# Patient Record
Sex: Female | Born: 1985 | Race: White | Hispanic: No | Marital: Single | Smoking: Never smoker
Health system: Southern US, Community
[De-identification: ages and names within clinical notes are randomized; demographics above are authoritative.]

## PROBLEM LIST (undated history)

## (undated) DIAGNOSIS — E079 Disorder of thyroid, unspecified: Secondary | ICD-10-CM

## (undated) DIAGNOSIS — F419 Anxiety disorder, unspecified: Secondary | ICD-10-CM

## (undated) HISTORY — DX: Disorder of thyroid, unspecified: E07.9

## (undated) HISTORY — DX: Anxiety disorder, unspecified: F41.9

## (undated) HISTORY — PX: WISDOM TOOTH EXTRACTION: SHX21

---

## 2007-02-12 ENCOUNTER — Emergency Department (HOSPITAL_COMMUNITY): Admission: EM | Admit: 2007-02-12 | Discharge: 2007-02-12 | Payer: Self-pay | Admitting: Emergency Medicine

## 2008-09-14 ENCOUNTER — Emergency Department (HOSPITAL_COMMUNITY): Admission: EM | Admit: 2008-09-14 | Discharge: 2008-09-15 | Payer: Self-pay | Admitting: Emergency Medicine

## 2010-06-18 IMAGING — CR DG CHEST 2V
2 series · 2 of 2 positions shown · non-contrast
Comparison: None.

CLINICAL DATA: Left-sided chest pain.
Pneumothorax.

CHEST - 2 VIEW

[w chest pa]
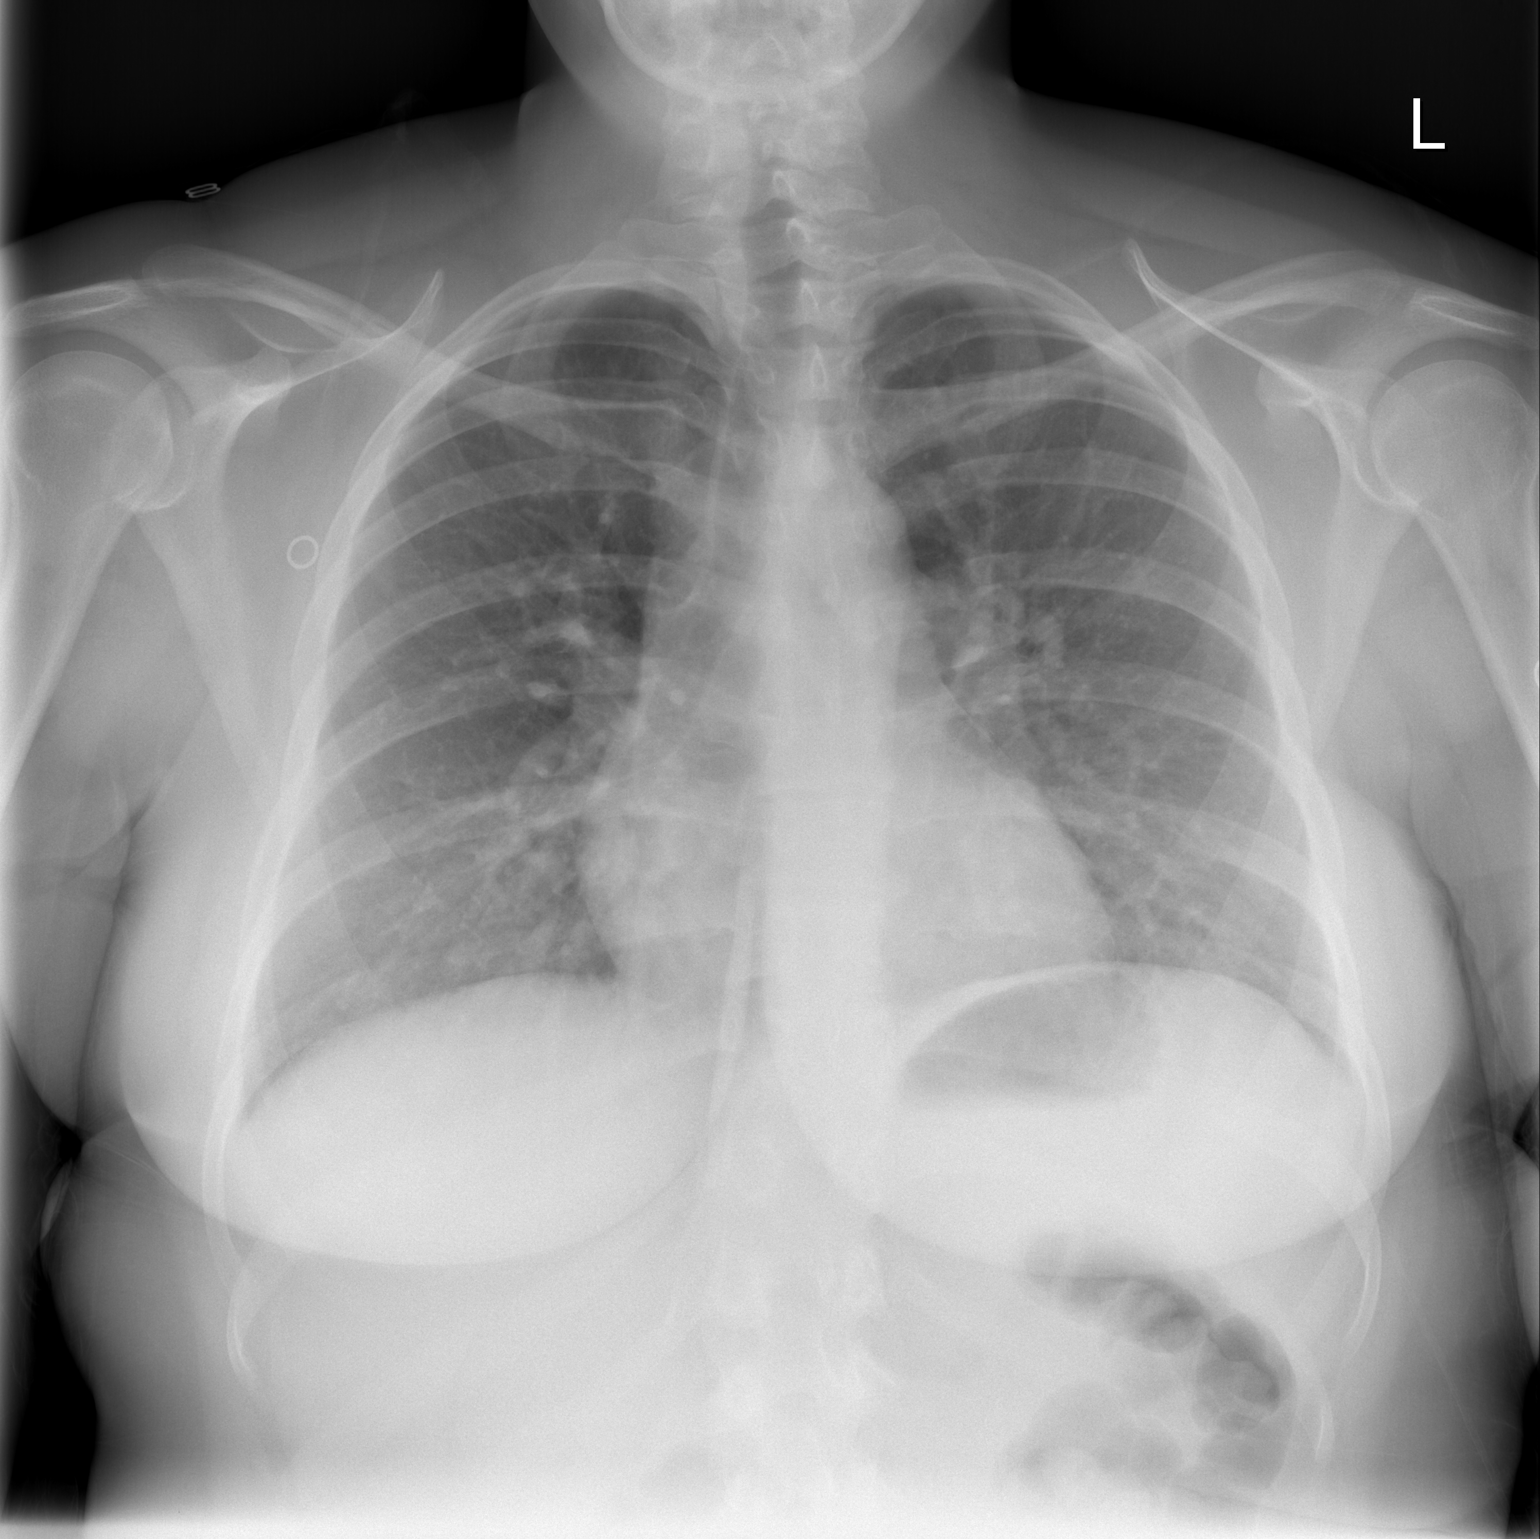

[w chest lat]
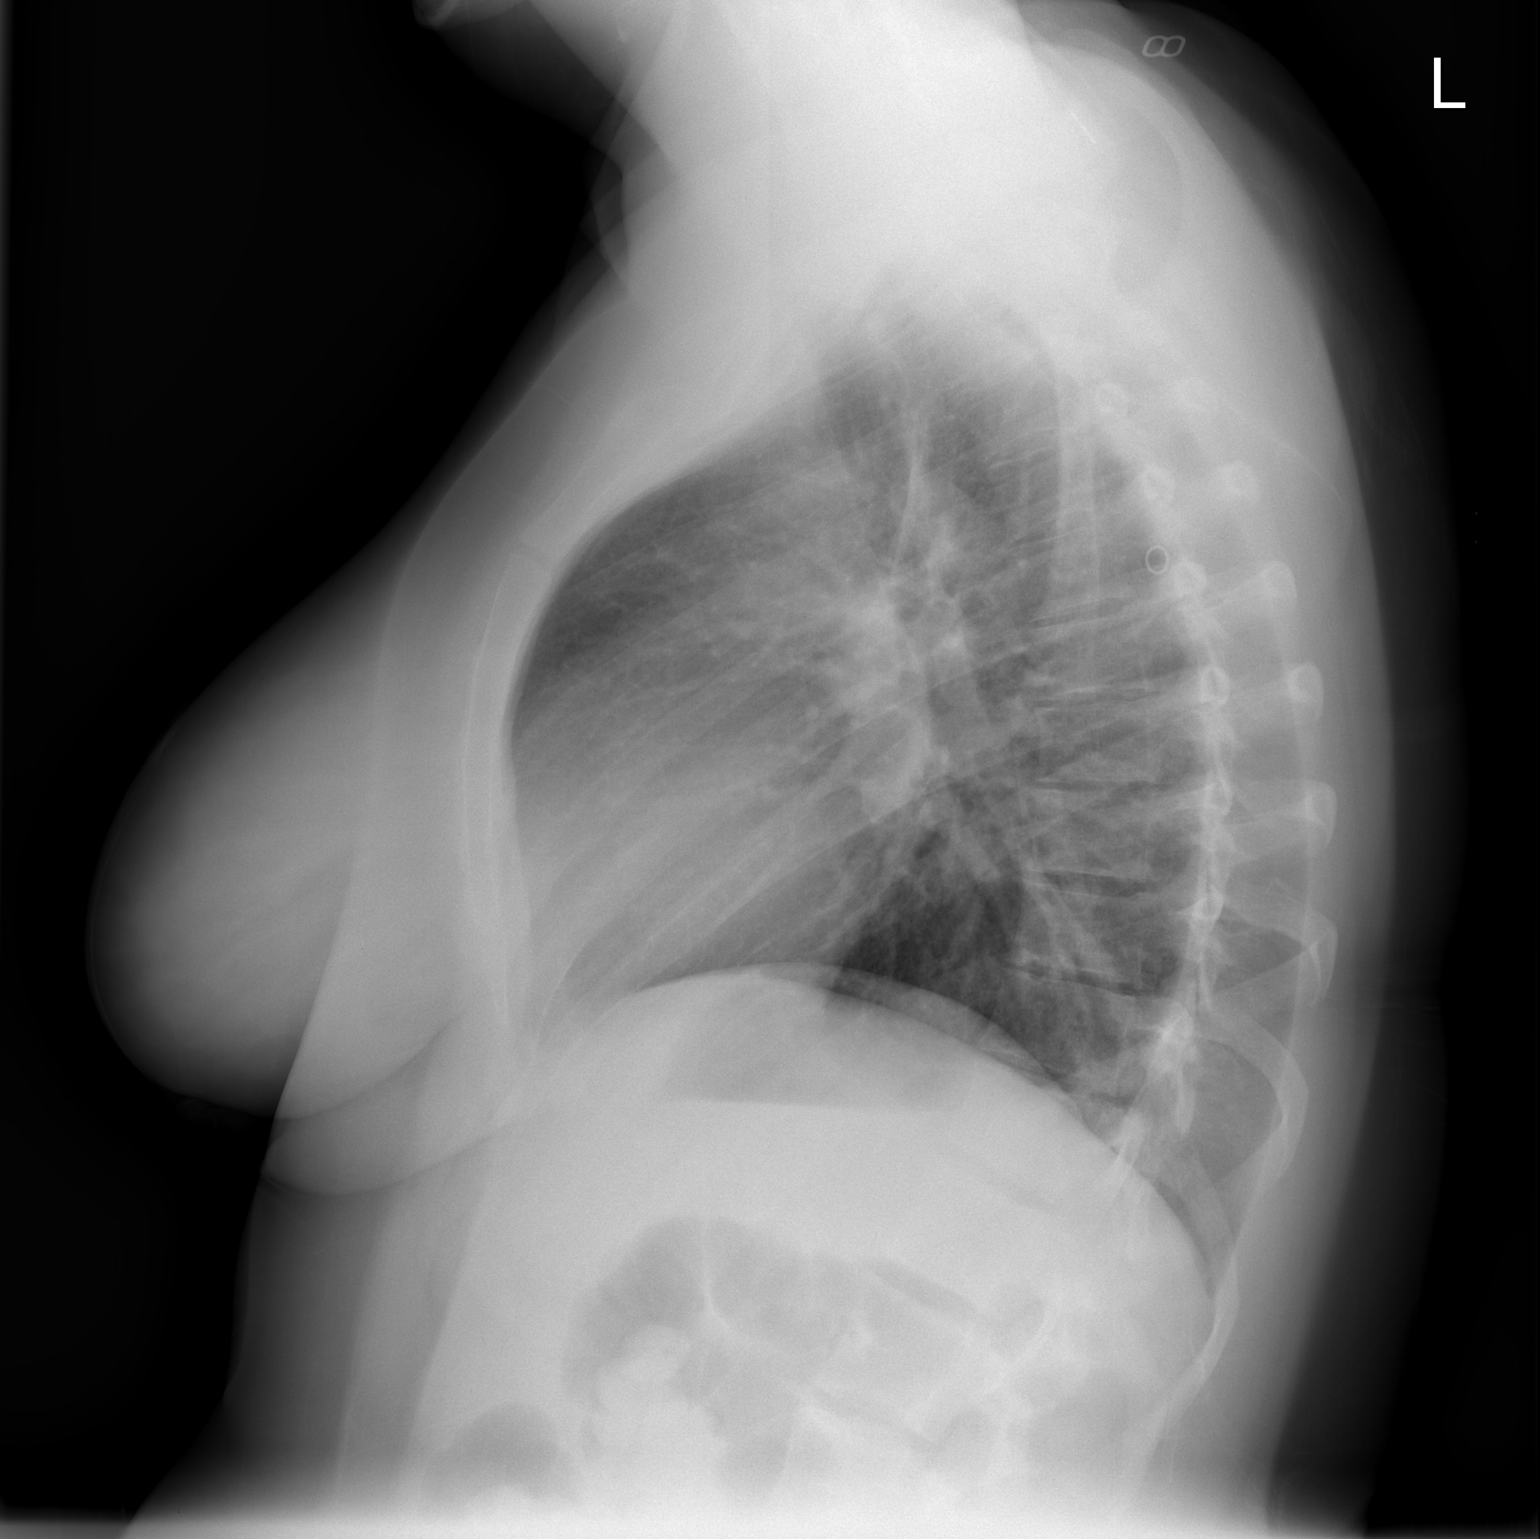

[2 of 2 positions shown; findings below may reference images not displayed]

FINDINGS: No infiltrate, congestive heart failure or
pneumothorax.  Mild central pulmonary vascular prominence.  Heart
size within normal limits.  Mild thoracic kyphosis.
IMPRESSION: No infiltrate, congestive heart failure or
pneumothorax.

## 2010-12-11 LAB — URINALYSIS, ROUTINE W REFLEX MICROSCOPIC
Bilirubin Urine: NEGATIVE
Ketones, ur: 15 — AB
Nitrite: NEGATIVE
Protein, ur: NEGATIVE
Urobilinogen, UA: 0.2

## 2010-12-11 LAB — BASIC METABOLIC PANEL
BUN: 9
CO2: 26
Calcium: 9.5
GFR calc non Af Amer: >60
Glucose, Bld: 97
Sodium: 138

## 2010-12-11 LAB — CBC
HCT: 40.4
Hemoglobin: 14.3
MCHC: 35.3
Platelets: 299
RDW: 13.2

## 2010-12-11 LAB — DIFFERENTIAL
Basophils Absolute: 0
Basophils Relative: 0
Eosinophils Relative: 0
Monocytes Absolute: 0.3
Neutro Abs: 10.3 — ABNORMAL HIGH

## 2013-10-30 ENCOUNTER — Ambulatory Visit: Payer: Self-pay | Admitting: Physician Assistant

## 2013-11-05 ENCOUNTER — Ambulatory Visit (INDEPENDENT_AMBULATORY_CARE_PROVIDER_SITE_OTHER): Payer: BC Managed Care – PPO | Admitting: Physician Assistant

## 2013-11-05 ENCOUNTER — Encounter: Payer: Self-pay | Admitting: Physician Assistant

## 2013-11-05 VITALS — BP 110/70 | HR 80 | Temp 98.0°F | Ht 67.0 in | Wt 251.0 lb

## 2013-11-05 DIAGNOSIS — K2100 Gastro-esophageal reflux disease with esophagitis, without bleeding: Secondary | ICD-10-CM | POA: Insufficient documentation

## 2013-11-05 DIAGNOSIS — R1013 Epigastric pain: Secondary | ICD-10-CM

## 2013-11-05 DIAGNOSIS — IMO0001 Reserved for inherently not codable concepts without codable children: Secondary | ICD-10-CM | POA: Insufficient documentation

## 2013-11-05 DIAGNOSIS — E063 Autoimmune thyroiditis: Secondary | ICD-10-CM | POA: Insufficient documentation

## 2013-11-05 DIAGNOSIS — F411 Generalized anxiety disorder: Secondary | ICD-10-CM

## 2013-11-05 LAB — COMPREHENSIVE METABOLIC PANEL
ALBUMIN: 4 g/dL (ref 3.5–5.2)
ALT: 75 U/L — ABNORMAL HIGH (ref 0–35)
AST: 46 U/L — ABNORMAL HIGH (ref 0–37)
Alkaline Phosphatase: 44 U/L (ref 39–117)
BUN: 14 mg/dL (ref 6–23)
CALCIUM: 9.6 mg/dL (ref 8.4–10.5)
CHLORIDE: 102 meq/L (ref 96–112)
CO2: 28 mEq/L (ref 19–32)
Creatinine, Ser: 1 mg/dL (ref 0.4–1.2)
GFR: 71.93 mL/min (ref >60.00)
GLUCOSE: 83 mg/dL (ref 70–99)
POTASSIUM: 4 meq/L (ref 3.5–5.1)
SODIUM: 138 meq/L (ref 135–145)
TOTAL PROTEIN: 7.8 g/dL (ref 6.0–8.3)
Total Bilirubin: 0.6 mg/dL (ref 0.2–1.2)

## 2013-11-05 LAB — CBC
HCT: 39.7 % (ref 36.0–46.0)
Hemoglobin: 13.5 g/dL (ref 12.0–15.0)
MCHC: 34.1 g/dL (ref 30.0–36.0)
MCV: 87.8 fl (ref 78.0–100.0)
PLATELETS: 282 10*3/uL (ref 150.0–400.0)
RBC: 4.52 Mil/uL (ref 3.87–5.11)
RDW: 13 % (ref 11.5–15.5)
WBC: 8.3 10*3/uL (ref 4.0–10.5)

## 2013-11-05 LAB — LIPASE: LIPASE: 18 U/L (ref 11.0–59.0)

## 2013-11-05 LAB — H. PYLORI ANTIBODY, IGG: H PYLORI IGG: NEGATIVE

## 2013-11-05 MED ORDER — OMEPRAZOLE 40 MG PO CPDR: 40.0000 mg | DELAYED_RELEASE_CAPSULE | Freq: Every day | ORAL | Status: DC

## 2013-11-05 NOTE — Assessment & Plan Note (Signed)
Patient does not wish to proceed with Rx medication at present.  Discussed supplements including Valerian Root, Melatonin and St. John's Wort to help with anxiety.  Follow-up PRN.

## 2013-11-05 NOTE — Assessment & Plan Note (Signed)
Will obtain CBC, CMP, lipase and H. Pylori IgG due to epigastric and esophageal discomfort.  Rx Prilosec 40 mg daily.  Avoid late night eating and trigger foods.  Elevate HOB. Have recommended evaluation by GI. Patient wishes to get lab results and use Prilosec first.  I am ok with this.

## 2013-11-05 NOTE — Progress Notes (Signed)
Pre visit review using our clinic review tool, if applicable. No additional management support is needed unless otherwise documented below in the visit note. 

## 2013-11-05 NOTE — Patient Instructions (Signed)
Please take Prilosec once daily as directed.  Stay well hydrated.  Continue your other medications as directed.  Avoid alcohol and anti-inflammatories (Iburprofen, Aleve).  I will call you with your lab results.  If symptoms are not improving despite medication, we will set you up with Gastroenterology for endoscopy.   For stress, consider letting us start a medication to help with symptoms.  I would recommend Celexa.  Please read information below.  You can also give some thought to [facility]. John's Wort or Valerian root.  Continue your Melatonin nightly.  We will schedule follow-up based on your lab results.   Valerian, Valeriana officinalis oral dosage forms What is this medicine? VALERIAN (vuh LEER ee uhn) is an herbal or dietary supplement. It is promoted to help relaxation, sleep and stress. The FDA has not approved this supplement for any medical use. This supplement may be used for other purposes; ask your health care provider or pharmacist if you have questions. This medicine may be used for other purposes; ask your health care provider or pharmacist if you have questions. What should I tell my health care provider before I take this medicine? They need to know if you have any of these conditions: -drug abuse or addiction -emotional illness like anxiety, depression -heart disease -kidney disease -liver disease -if you frequently drink alcohol containing drinks -sleeping problems -an unusual or allergic reaction to valerian, herbs, plants, other medicines, foods, dyes, or preservatives -pregnant or trying to get pregnant -breast-feeding How should I use this medicine? Take this supplement by mouth with a glass of water. Follow the directions on the package labeling, or take as directed by your health care professional. If this supplement upsets your stomach, take it with food. Do not take this supplement more often than directed. Contact your pediatrician regarding the use of this  supplement in children. Special care may be needed. Overdosage: If you think you have taken too much of this medicine contact a poison control center or emergency room at once. NOTE: This medicine is only for you. Do not share this medicine with others. What if I miss a dose? If you miss a dose, take it as soon as you can. If it is almost time for your next dose, take only that dose. Do not take double or extra doses. What may interact with this medicine? Check with your doctor or healthcare professional if you are taking any of the following medications: -alcohol -barbiturate medicines for sleep or seizures -medicines for depression, anxiety, or psychotic disturbances -medicines for sleep -muscle relaxants -narcotic pain medicines This list may not describe all possible interactions. Give your health care provider a list of all the medicines, herbs, non-prescription drugs, or dietary supplements you use. Also tell them if you smoke, drink alcohol, or use illegal drugs. Some items may interact with your medicine. What should I watch for while using this medicine? See your doctor if your symptoms do not get better or if they get worse. You may get drowsy or dizzy. Do not drive, use machinery, or do anything that needs mental alertness until you know how this medicine affects you. Do not stand or sit up quickly, especially if you are an older patient. This reduces the risk of dizzy or fainting spells. Alcohol may interfere with the effect of this medicine. If you are scheduled for any medical or dental procedure, tell your healthcare provider that you are taking this supplement. You may need to stop taking this supplement before the procedure.  Herbal or dietary supplements are not regulated like medicines. Rigid quality control standards are not required for dietary supplements. The purity and strength of these products can vary. The safety and effect of this dietary supplement for a certain disease  or illness is not well known. This product is not intended to diagnose, treat, cure or prevent any disease. The Food and Drug Administration suggests the following to help consumers protect themselves: -Always read product labels and follow directions. -Natural does not mean a product is safe for humans to take. -Look for products that include USP after the ingredient name. This means that the manufacturer followed the standards of the [place] Pharmacopoeia. -Supplements made or sold by a nationally known food or drug company are more likely to be made under tight controls. You can write to the company for more information about how the product was made. What side effects may I notice from receiving this medicine? Side effects that you should report to your doctor or health care professional as soon as possible: -allergic reactions like skin rash, itching or hives, swelling of the face, lips, or tongue -breathing problems -changes in emotions or moods, like depressed mood -confused, forgetful -dark urine -fast, irregular heartbeat -problems with balance, talking, walking -unusually weak or tired -yellowing of the eyes, skin Side effects that usually do not require medical attention (report to your doctor or health care professional if they continue or are bothersome): -stomach upset -dizziness -tiredness This list may not describe all possible side effects. Call your doctor for medical advice about side effects. You may report side effects to FDA at 1-800-FDA-1088. Where should I keep my medicine? Keep out of the reach of children. Store at room temperature or as directed on the package label. Protect from moisture. Throw away any unused supplement after the expiration date. NOTE: This sheet is a summary. It may not cover all possible information. If you have questions about this medicine, talk to your doctor, pharmacist, or health care provider.  2015, Elsevier/Gold Standard. (2007-11-17  17:33:52)   Citalopram tablets What is this medicine? CITALOPRAM (sye TAL oh pram) is a medicine for depression. This medicine may be used for other purposes; ask your health care provider or pharmacist if you have questions. COMMON BRAND NAME(S): Celexa What should I tell my health care provider before I take this medicine? They need to know if you have any of these conditions: -bipolar disorder or a family history of bipolar disorder -diabetes -glaucoma -heart disease -history of irregular heartbeat -kidney or liver disease -low levels of magnesium or potassium in the blood -receiving electroconvulsive therapy -seizures (convulsions) -suicidal thoughts or a previous suicide attempt -an unusual or allergic reaction to citalopram, escitalopram, other medicines, foods, dyes, or preservatives -pregnant or trying to become pregnant -breast-feeding How should I use this medicine? Take this medicine by mouth with a glass of water. Follow the directions on the prescription label. You can take it with or without food. Take your medicine at regular intervals. Do not take your medicine more often than directed. Do not stop taking this medicine suddenly except upon the advice of your doctor. Stopping this medicine too quickly may cause serious side effects or your condition may worsen. A special MedGuide will be given to you by the pharmacist with each prescription and refill. Be sure to read this information carefully each time. Talk to your pediatrician regarding the use of this medicine in children. Special care may be needed. Patients over 43 years old 45  have a stronger reaction and need a smaller dose. Overdosage: If you think you have taken too much of this medicine contact a poison control center or emergency room at once. NOTE: This medicine is only for you. Do not share this medicine with others. What if I miss a dose? If you miss a dose, take it as soon as you can. If it is almost  time for your next dose, take only that dose. Do not take double or extra doses. What may interact with this medicine? Do not take this medicine with any of the following medications: -certain medicines for fungal infections like fluconazole, itraconazole, ketoconazole, posaconazole, voriconazole -cisapride -dofetilide -dronedarone -escitalopram -linezolid -MAOIs like Carbex, Eldepryl, Marplan, Nardil, and Parnate -methylene blue (injected into a vein) -pimozide -thioridazine -ziprasidone This medicine may also interact with the following medications: -alcohol -aspirin and aspirin-like medicines -carbamazepine -certain medicines for depression, anxiety, or psychotic disturbances -certain medicines for infections like chloroquine, clarithromycin, erythromycin, furazolidone, isoniazid, pentamidine -certain medicines for migraine headaches like almotriptan, eletriptan, frovatriptan, naratriptan, rizatriptan, sumatriptan, zolmitriptan -certain medicines for sleep -certain medicines that treat or prevent blood clots like dalteparin, enoxaparin, warfarin -cimetidine -diuretics -fentanyl -lithium -methadone -metoprolol -NSAIDs, medicines for pain and inflammation, like ibuprofen or naproxen -omeprazole -other medicines that prolong the QT interval (cause an abnormal heart rhythm) -procarbazine -rasagiline -supplements like St. John's wort, kava kava, valerian -tramadol -tryptophan This list may not describe all possible interactions. Give your health care provider a list of all the medicines, herbs, non-prescription drugs, or dietary supplements you use. Also tell them if you smoke, drink alcohol, or use illegal drugs. Some items may interact with your medicine. What should I watch for while using this medicine? Tell your doctor if your symptoms do not get better or if they get worse. Visit your doctor or health care professional for regular checks on your progress. Because it may  take several weeks to see the full effects of this medicine, it is important to continue your treatment as prescribed by your doctor. Patients and their families should watch out for new or worsening thoughts of suicide or depression. Also watch out for sudden changes in feelings such as feeling anxious, agitated, panicky, irritable, hostile, aggressive, impulsive, severely restless, overly excited and hyperactive, or not being able to sleep. If this happens, especially at the beginning of treatment or after a change in dose, call your health care professional. Bonita Quin may get drowsy or dizzy. Do not drive, use machinery, or do anything that needs mental alertness until you know how this medicine affects you. Do not stand or sit up quickly, especially if you are an older patient. This reduces the risk of dizzy or fainting spells. Alcohol may interfere with the effect of this medicine. Avoid alcoholic drinks. Your mouth may get dry. Chewing sugarless gum or sucking hard candy, and drinking plenty of water will help. Contact your doctor if the problem does not go away or is severe. What side effects may I notice from receiving this medicine? Side effects that you should report to your doctor or health care professional as soon as possible: -allergic reactions like skin rash, itching or hives, swelling of the face, lips, or tongue -chest pain -confusion -dizziness -fast, irregular heartbeat -fast talking and excited feelings or actions that are out of control -feeling faint or lightheaded, falls -hallucination, loss of contact with reality -seizures -shortness of breath -suicidal thoughts or other mood changes -unusual bleeding or bruising Side effects that usually do not require  medical attention (report to your doctor or health care professional if they continue or are bothersome): -blurred vision -change in appetite -change in sex drive or performance -headache -increased  sweating -nausea -trouble sleeping This list may not describe all possible side effects. Call your doctor for medical advice about side effects. You may report side effects to FDA at 1-800-FDA-1088. Where should I keep my medicine? Keep out of reach of children. Store at room temperature between 15 and 30 degrees C (59 and 86 degrees F). Throw away any unused medicine after the expiration date. NOTE: This sheet is a summary. It may not cover all possible information. If you have questions about this medicine, talk to your doctor, pharmacist, or health care provider.  2015, Elsevier/Gold Standard. (2012-09-12 13:19:48)

## 2013-11-05 NOTE — Progress Notes (Signed)
Patient presents to clinic today to establish care.  Acute Concerns: Anxiety -- With occasional trichotillomania.  Denies overt panic attack.  Endorses anxiety stemming mostly from increased work stressors.  Is taking Melatonin at bedtime. Denies depressed mood, SI/HI. Also with history  Is hesitant to start medications.  GERD -- Patient complains of frequent heartburn and reflux associated with occasional epigastric discomfort.  Patient denies nausea or vomiting.  Endorses globus sensation.  Denies melena or hematochezia.  Is a rare alcohol drinker.  Denies excess NSAID use.  Denies hx of PUD.  Has only take TUMS for her symptoms.  Chronic Issues: Hashimoto's Thyroiditis -- Followed by Endocrinology ([facility] -- Dr. Clair Gulling).  Patient has brought in labs for our EMR.  Contraception -- Followed by [facility]. Currently on Skyla impant.   Health Maintenance: Dental -- up-to-date Vision -- up-to-date Immunizations -- Tetanus 2006.  PAP -- Last in 2014; no abnormal findings -- [facility].   Past Medical History  Diagnosis Date  . Anxiety   . Thyroid disease     Hashimoto's Thyroiditis    Past Surgical History  Procedure Laterality Date  . Wisdom tooth extraction      No current outpatient prescriptions on file prior to visit.   No current facility-administered medications on file prior to visit.    Allergies  Allergen Reactions  . Penicillins   . Amoxicillin Rash    Family History  Problem Relation Age of Onset  . Cancer Paternal Grandmother     Lymphoma  . Diabetes Paternal Grandfather     History   Social History  . Marital Status: Single    Spouse Name: N/A    Number of Children: N/A  . Years of Education: N/A   Occupational History  . Not on file.   Social History Main Topics  . Smoking status: Never Smoker   . Smokeless tobacco: Never Used  . Alcohol Use: Yes     Comment: socially  . Drug Use: No  . Sexual  Activity: Yes     Comment: men-- on [place]   Other Topics Concern  . Not on file   Social History Narrative  . No narrative on file   ROS See HPI.  All other ROS are negative.  BP 110/70  Pulse 80  Temp(Src) 98 F (36.7 C)  Ht  (1.702 m)  Wt 251 lb (113.853 kg)  BMI 39.30 kg/m2  SpO2 99%  Physical Exam  Vitals reviewed. Constitutional: She is oriented to person, place, and time and well-developed, well-nourished, and in no distress.  HENT:  Head: Normocephalic and atraumatic.  Right Ear: External ear normal.  Left Ear: External ear normal.  Nose: Nose normal.  Mouth/Throat: Oropharynx is clear and moist. No oropharyngeal exudate.  TM within normal limits bilaterally.  Eyes: Conjunctivae are normal. Pupils are equal, round, and reactive to light.  Neck: Neck supple.  Cardiovascular: Normal rate, regular rhythm, normal heart sounds and intact distal pulses.   Pulmonary/Chest: Effort normal and breath sounds normal.  Abdominal: Soft. Bowel sounds are normal. She exhibits no distension and no mass. There is no tenderness. There is no rebound and no guarding.  Neurological: She is alert and oriented to person, place, and time.  Skin: Skin is warm and dry. No rash noted.  Psychiatric: Affect normal.   Assessment/Plan: Gastroesophageal reflux disease with esophagitis Will obtain CBC, CMP, lipase and H. Pylori IgG due to epigastric and esophageal discomfort.  Rx Prilosec  40 mg daily.  Avoid late night eating and trigger foods.  Elevate HOB. Have recommended evaluation by GI. Patient wishes to get lab results and use Prilosec first.  I am ok with this.  Abdominal pain, epigastric Will obtain labs to further assess.  Likely GERD related. Take medications as directed.  Avoid alcohol and NSAID use.  Will refer to GI pending lab results.  Generalized anxiety disorder Patient does not wish to proceed with Rx medication at present.  Discussed supplements including Valerian Root,  Melatonin and St. John's Wort to help with anxiety.  Follow-up PRN.

## 2013-11-05 NOTE — Assessment & Plan Note (Signed)
Will obtain labs to further assess.  Likely GERD related. Take medications as directed.  Avoid alcohol and NSAID use.  Will refer to GI pending lab results.

## 2013-11-12 ENCOUNTER — Telehealth: Payer: Self-pay | Admitting: Physician Assistant

## 2013-11-12 NOTE — Telephone Encounter (Signed)
Caller name: Youa Relation to pt: self Call back number: [phone] Pharmacy:  Reason for call:   Patient states that she has questions regarding liver enzymes results

## 2013-11-12 NOTE — Telephone Encounter (Signed)
Notes Recorded by Eustace Quail, RMA on 11/05/2013 at 3:14 PM Pt notified. Pt verbalized understanding. Notes Recorded by Waldon Merl, PA-C on 11/05/2013 at 2:28 PM Labs look good overall. H. Pylori is negative. She does have very mild liver enzyme elevation. I would like to recheck her liver function in 1 month. Avoid alcohol and tylenol-containing products. Take Prilosec daily over the next 2 weeks. If symptoms are not improving, will send her to GI for an endoscopy and evaluation.

## 2013-12-01 ENCOUNTER — Telehealth: Payer: Self-pay | Admitting: Physician Assistant

## 2013-12-01 DIAGNOSIS — R748 Abnormal levels of other serum enzymes: Secondary | ICD-10-CM

## 2013-12-01 NOTE — Telephone Encounter (Signed)
Caller name: Ashely Relation to pt: Call back number: [phone]   Reason for call:  See previous phone message from 11/12/2013.  Pt would like a phone call to discuss.

## 2013-12-02 NOTE — Telephone Encounter (Signed)
Spoke with patient and went over results in detail, pt understood & agreed; lab order for repeat BMET faxed to [facility] at patient request. Patient informed that we will call her as soon as we receive [facility]. Pt reports she is doing great on the Prilosec and will continue daily/SLS

## 2013-12-02 NOTE — Telephone Encounter (Signed)
Pt following up, states she has been waiting almost a month to get lab results.

## 2013-12-02 NOTE — Telephone Encounter (Signed)
Call Documentation     Regis BillSharon L Scates, CMA at 11/12/2013 1:09 PM     Status: Signed        Notes Recorded by Eustace Quailavid J Reabold, RMA on 11/05/2013 at 3:14 PM Pt notified. Pt verbalized understanding. Notes Recorded by Waldon MerlWilliam C Martin, PA-C on 11/05/2013 at 2:28 PM Labs look good overall. H. Pylori is negative. She does have very mild liver enzyme elevation. I would like to recheck her liver function in 1 month. Avoid alcohol and tylenol-containing products. Take Prilosec daily over the next 2 weeks. If symptoms are not improving, will send her to GI for an endoscopy and evaluation.          Dedra SkeensStephanie S Stevens at 11/12/2013 9:20 AM     Status: Signed        Caller name: Morrie Sheldonshley  Relation to pt: self  Call back number: [phone]  Pharmacy:  Reason for call:  Patient states that she has questions regarding liver enzymes results

## 2013-12-04 ENCOUNTER — Telehealth: Payer: Self-pay | Admitting: Physician Assistant

## 2013-12-04 NOTE — Telephone Encounter (Signed)
Caller name: [facility]  Relation to pt: other  Call back number: [phone]  Reason for call:  Patient was seen today [facility] is requesting ICD 10 Dx Code

## 2013-12-04 NOTE — Telephone Encounter (Signed)
Called [facility] and gave ICD-10 Code: R74.8, Elevated Liver Enzymes/SLS

## 2014-01-03 LAB — HM PAP SMEAR: HM PAP: NORMAL

## 2014-04-02 ENCOUNTER — Other Ambulatory Visit: Payer: Self-pay | Admitting: Physician Assistant

## 2014-04-02 NOTE — Telephone Encounter (Signed)
Rx request to pharmacy/SLS  

## 2014-04-09 ENCOUNTER — Ambulatory Visit (INDEPENDENT_AMBULATORY_CARE_PROVIDER_SITE_OTHER): Payer: BLUE CROSS/BLUE SHIELD | Admitting: Physician Assistant

## 2014-04-09 ENCOUNTER — Encounter: Payer: Self-pay | Admitting: Physician Assistant

## 2014-04-09 VITALS — BP 116/73 | HR 86 | Temp 98.1°F | Resp 16 | Ht 67.0 in | Wt 253.5 lb

## 2014-04-09 DIAGNOSIS — S29011A Strain of muscle and tendon of front wall of thorax, initial encounter: Secondary | ICD-10-CM

## 2014-04-09 DIAGNOSIS — S29091A Other injury of muscle and tendon of front wall of thorax, initial encounter: Secondary | ICD-10-CM

## 2014-04-09 NOTE — Progress Notes (Signed)
Pre visit review using our clinic review tool, if applicable. No additional management support is needed unless otherwise documented below in the visit note/SLS  

## 2014-04-09 NOTE — Patient Instructions (Signed)
Please take Ibuprofen every 6-8 hours for the next few days to help lower inflammation.  Apply topical Aspercreme to the shoulder.  Avoid heavy lifting or overhead motion of the arms over the next week. Ok to continue aerobic exercise.   Rotator Cuff Tendinitis  Rotator cuff tendinitis is inflammation of the tough, cord-like bands that connect muscle to bone (tendons) in your rotator cuff. Your rotator cuff is the collection of all the muscles and tendons that connect your arm to your shoulder. Your rotator cuff holds the head of your upper arm bone (humerus) in the cup (fossa) of your shoulder blade (scapula). CAUSES Rotator cuff tendinitis is usually caused by overusing the joint involved.  SIGNS AND SYMPTOMS  Deep ache in the shoulder also felt on the outside upper arm over the shoulder muscle.  Point tenderness over the area that is injured.  Pain comes on gradually and becomes worse with lifting the arm to the side (abduction) or turning it inward (internal rotation).  May lead to a chronic tear: When a rotator cuff tendon becomes inflamed, it runs the risk of losing its blood supply, causing some tendon fibers to die. This increases the risk that the tendon can fray and partially or completely tear. DIAGNOSIS Rotator cuff tendinitis is diagnosed by taking a medical history, performing a physical exam, and reviewing results of imaging exams. The medical history is useful to help determine the type of rotator cuff injury. The physical exam will include looking at the injured shoulder, feeling the injured area, and watching you do range-of-motion exercises. X-ray exams are typically done to rule out other causes of shoulder pain, such as fractures. MRI is the imaging exam usually used for significant shoulder injuries. Sometimes a dye study called CT arthrogram is done, but it is not as widely used as MRI. In some institutions, special ultrasound tests may also be used to aid in the  diagnosis. TREATMENT  Less Severe Cases  Use of a sling to rest the shoulder for a short period of time. Prolonged use of the sling can cause stiffness, weakness, and loss of motion of the shoulder joint.  Anti-inflammatory medicines, such as ibuprofen or naproxen sodium, may be prescribed. More Severe Cases  Physical therapy.  Use of steroid injections into the shoulder joint.  Surgery. HOME CARE INSTRUCTIONS   Use a sling or splint until the pain decreases. Prolonged use of the sling can cause stiffness, weakness, and loss of motion of the shoulder joint.  Apply ice to the injured area:  Put ice in a plastic bag.  Place a towel between your skin and the bag.  Leave the ice on for 20 minutes, 2-3 times a day.  Try to avoid use other than gentle range of motion while your shoulder is painful. Use the shoulder and exercise only as directed by your health care provider. Stop exercises or range of motion if pain or discomfort increases, unless directed otherwise by your health care provider.  Only take over-the-counter or prescription medicines for pain, discomfort, or fever as directed by your health care provider.  If you were given a shoulder sling and straps (immobilizer), do not remove it except as directed, or until you see a health care provider for a follow-up exam. If you need to remove it, move your arm as little as possible or as directed.  You may want to sleep on several pillows at night to lessen swelling and pain. SEEK IMMEDIATE MEDICAL CARE IF:   Your  shoulder pain increases or new pain develops in your arm, hand, or fingers and is not relieved with medicines.  You have new, unexplained symptoms, especially increased numbness in the hands or loss of strength.  You develop any worsening of the problems that brought you in for care.  Your arm, hand, or fingers are numb or tingling.  Your arm, hand, or fingers are swollen, painful, or turn white or blue. MAKE  SURE YOU:  Understand these instructions.  Will watch your condition.  Will get help right away if you are not doing well or get worse. Document Released: 05/12/2003 Document Revised: 12/10/2012 Document Reviewed: 10/01/2012 [facility] Patient Information 2015 [employer], [place]. This information is not intended to replace advice given to you by your health care provider. Make sure you discuss any questions you have with your health care provider.

## 2014-04-09 NOTE — Progress Notes (Signed)
   Patient presents to clinic today c/o pain in R axillary and brachial region that has been present intermittently over the past 3 weeks.  Patient denies trauma or injury, but does endorse starting workout regimen with resistance training about 4 weeks ago.  Denies numbness or tingling. Pain is throbbing in nature and worse with ROM.  Denies bruising. Has not taken anything for symptoms.  Past Medical History  Diagnosis Date  . Anxiety   . Thyroid disease     Hashimoto's Thyroiditis    Current Outpatient Prescriptions on File Prior to Visit  Medication Sig Dispense Refill  . Misc Natural Products (MULTI-HERB PO) Take by mouth.    Marland Kitchen. NATURE-THROID 65 MG tablet     . omeprazole (PRILOSEC) 40 MG capsule TAKE ONE CAPSULE BY MOUTH DAILY 30 capsule 0   No current facility-administered medications on file prior to visit.    Allergies  Allergen Reactions  . Penicillins   . Amoxicillin Rash    Family History  Problem Relation Age of Onset  . Cancer Paternal Grandmother     Lymphoma  . Diabetes Paternal Grandfather     History   Social History  . Marital Status: Single    Spouse Name: N/A    Number of Children: N/A  . Years of Education: N/A   Social History Main Topics  . Smoking status: Never Smoker   . Smokeless tobacco: Never Used  . Alcohol Use: Yes     Comment: socially  . Drug Use: No  . Sexual Activity: Yes     Comment: men-- on [place]   Other Topics Concern  . None   Social History Narrative   Review of Systems - See HPI.  All other ROS are negative.  BP 116/73 mmHg  Pulse 86  Temp(Src) 98.1 F (36.7 C) (Oral)  Resp 16  Ht 5\' 7"  (1.702 m)  Wt 253 lb 8 oz (114.987 kg)  BMI 39.69 kg/m2  SpO2 99%  LMP 04/09/2014  Physical Exam  Constitutional: She is oriented to person, place, and time and well-developed, well-nourished, and in no distress.  HENT:  Head: Normocephalic and atraumatic.  Eyes: Conjunctivae are normal.  Neck: Neck supple.    Cardiovascular: Normal rate, regular rhythm, normal heart sounds and intact distal pulses.   Pulmonary/Chest: Effort normal and breath sounds normal. No respiratory distress. She has no wheezes. She has no rales. She exhibits no tenderness.  Musculoskeletal:       Right shoulder: She exhibits tenderness. She exhibits no bony tenderness, no spasm, normal pulse and normal strength.       Arms: Lymphadenopathy:       Head (right side): No occipital adenopathy present.       Head (left side): No occipital adenopathy present.    She has no cervical adenopathy.    She has no axillary adenopathy.       Right: No supraclavicular and no epitrochlear adenopathy present.       Left: No supraclavicular and no epitrochlear adenopathy present.  Neurological: She is alert and oriented to person, place, and time.  Skin: Skin is warm and dry. No rash noted.  Psychiatric: Affect normal.  Vitals reviewed.  Assessment/Plan: Pectoralis muscle strain RICE therapy.  Aleve daily. Limit resistance training until symptoms resolve. Follow-up if no improvement over the next 7-10 days.

## 2014-04-11 DIAGNOSIS — S29011A Strain of muscle and tendon of front wall of thorax, initial encounter: Secondary | ICD-10-CM | POA: Insufficient documentation

## 2014-04-11 NOTE — Assessment & Plan Note (Signed)
RICE therapy.  Aleve daily. Limit resistance training until symptoms resolve. Follow-up if no improvement over the next 7-10 days.

## 2014-06-09 ENCOUNTER — Other Ambulatory Visit: Payer: Self-pay | Admitting: Physician Assistant

## 2014-06-10 ENCOUNTER — Ambulatory Visit (INDEPENDENT_AMBULATORY_CARE_PROVIDER_SITE_OTHER): Payer: BLUE CROSS/BLUE SHIELD | Admitting: Licensed Clinical Social Worker

## 2014-06-10 DIAGNOSIS — F4323 Adjustment disorder with mixed anxiety and depressed mood: Secondary | ICD-10-CM

## 2014-06-24 ENCOUNTER — Ambulatory Visit (INDEPENDENT_AMBULATORY_CARE_PROVIDER_SITE_OTHER): Payer: BLUE CROSS/BLUE SHIELD | Admitting: Licensed Clinical Social Worker

## 2014-06-24 DIAGNOSIS — F4323 Adjustment disorder with mixed anxiety and depressed mood: Secondary | ICD-10-CM

## 2014-07-06 ENCOUNTER — Ambulatory Visit: Payer: BLUE CROSS/BLUE SHIELD | Admitting: Licensed Clinical Social Worker

## 2014-07-13 ENCOUNTER — Ambulatory Visit (INDEPENDENT_AMBULATORY_CARE_PROVIDER_SITE_OTHER): Payer: BLUE CROSS/BLUE SHIELD | Admitting: Licensed Clinical Social Worker

## 2014-07-13 DIAGNOSIS — F4323 Adjustment disorder with mixed anxiety and depressed mood: Secondary | ICD-10-CM | POA: Diagnosis not present

## 2014-07-20 ENCOUNTER — Ambulatory Visit: Payer: BLUE CROSS/BLUE SHIELD | Admitting: Licensed Clinical Social Worker

## 2014-08-03 ENCOUNTER — Ambulatory Visit (INDEPENDENT_AMBULATORY_CARE_PROVIDER_SITE_OTHER): Payer: BLUE CROSS/BLUE SHIELD | Admitting: Licensed Clinical Social Worker

## 2014-08-03 DIAGNOSIS — F4323 Adjustment disorder with mixed anxiety and depressed mood: Secondary | ICD-10-CM

## 2014-08-26 ENCOUNTER — Ambulatory Visit: Payer: BLUE CROSS/BLUE SHIELD | Admitting: Licensed Clinical Social Worker

## 2014-09-07 ENCOUNTER — Ambulatory Visit (INDEPENDENT_AMBULATORY_CARE_PROVIDER_SITE_OTHER): Payer: BLUE CROSS/BLUE SHIELD | Admitting: Licensed Clinical Social Worker

## 2014-09-07 DIAGNOSIS — F4323 Adjustment disorder with mixed anxiety and depressed mood: Secondary | ICD-10-CM | POA: Diagnosis not present

## 2014-09-30 ENCOUNTER — Ambulatory Visit: Payer: BLUE CROSS/BLUE SHIELD | Admitting: Licensed Clinical Social Worker

## 2014-10-13 ENCOUNTER — Other Ambulatory Visit (HOSPITAL_COMMUNITY)
Admission: RE | Admit: 2014-10-13 | Discharge: 2014-10-13 | Disposition: A | Discharge status: Home / Self Care | Payer: BLUE CROSS/BLUE SHIELD | Source: Ambulatory Visit | Attending: Hematology & Oncology | Admitting: Hematology & Oncology

## 2014-10-13 ENCOUNTER — Other Ambulatory Visit: Payer: BLUE CROSS/BLUE SHIELD | Admitting: Physician Assistant

## 2014-10-13 ENCOUNTER — Ambulatory Visit (INDEPENDENT_AMBULATORY_CARE_PROVIDER_SITE_OTHER): Payer: BLUE CROSS/BLUE SHIELD | Admitting: Physician Assistant

## 2014-10-13 ENCOUNTER — Encounter: Payer: Self-pay | Admitting: Physician Assistant

## 2014-10-13 VITALS — BP 130/80 | HR 97 | Temp 98.4°F | Ht 67.0 in | Wt 208.4 lb

## 2014-10-13 DIAGNOSIS — Z113 Encounter for screening for infections with a predominantly sexual mode of transmission: Secondary | ICD-10-CM

## 2014-10-13 DIAGNOSIS — Z8639 Personal history of other endocrine, nutritional and metabolic disease: Secondary | ICD-10-CM | POA: Diagnosis not present

## 2014-10-13 DIAGNOSIS — N760 Acute vaginitis: Secondary | ICD-10-CM | POA: Insufficient documentation

## 2014-10-13 LAB — ACUTE HEP PANEL AND HEP B SURFACE AB
HCV Ab: NEGATIVE
HEP B C IGM: NONREACTIVE
Hep A IgM: NONREACTIVE
Hep B S Ab: NEGATIVE
Hepatitis B Surface Ag: NEGATIVE

## 2014-10-13 LAB — TSH: TSH: 3.65 u[IU]/mL (ref 0.35–4.50)

## 2014-10-13 NOTE — Progress Notes (Signed)
   Patient presents to clinic today c/o requesting STD screening. Endorses recently finding out that her boyfriend has been cheating on her. Patient denies vaginal or constitutional symptoms. Denies personal history of STI.  Past Medical History  Diagnosis Date  . Anxiety   . Thyroid disease     Hashimoto's Thyroiditis    Current Outpatient Prescriptions on File Prior to Visit  Medication Sig Dispense Refill  . Misc Natural Products (MULTI-HERB PO) Take by mouth.    Marland Kitchen NATURE-THROID 65 MG tablet     . omeprazole (PRILOSEC) 40 MG capsule TAKE 1 CAPSULE BY MOUTH DAILY 30 capsule 0   No current facility-administered medications on file prior to visit.    Allergies  Allergen Reactions  . Penicillins   . Amoxicillin Rash    Family History  Problem Relation Age of Onset  . Cancer Paternal Grandmother     Lymphoma  . Diabetes Paternal Grandfather     Social History   Social History  . Marital Status: Single    Spouse Name: N/A  . Number of Children: N/A  . Years of Education: N/A   Social History Main Topics  . Smoking status: Never Smoker   . Smokeless tobacco: Never Used  . Alcohol Use: Yes     Comment: socially  . Drug Use: No  . Sexual Activity: Yes     Comment: men-- on [place]   Other Topics Concern  . None   Social History Narrative   Review of Systems - See HPI.  All other ROS are negative.  BP 130/80 mmHg  Pulse 97  Temp(Src) 98.4 F (36.9 C) (Oral)  Ht  (1.702 m)  Wt 208 lb 6.4 oz (94.53 kg)  BMI 32.63 kg/m2  SpO2 98%  LMP 09/22/2014  Physical Exam  Constitutional: She is oriented to person, place, and time and well-developed, well-nourished, and in no distress.  HENT:  Head: Normocephalic and atraumatic.  Cardiovascular: Normal rate, regular rhythm, normal heart sounds and intact distal pulses.   Pulmonary/Chest: Effort normal and breath sounds normal. No respiratory distress. She has no wheezes. She has no rales. She exhibits no  tenderness.  Neurological: She is alert and oriented to person, place, and time.  Skin: Skin is warm and dry. No rash noted.  Psychiatric: Affect normal.  Vitals reviewed.    Assessment/Plan: Screening for STD (sexually transmitted disease) Asymptomatic. Unclear risk due to unknown status of boyfriend who has been unfaithful. Will obtain STI panel. Will treat if anything abnormal.

## 2014-10-13 NOTE — Patient Instructions (Signed)
Please go to the lab for a urine test and blood work. I will call you with all of your results.  Keep up the good work with diet and exercise! Your tattoo is healing well. There is no sign of infection.  Follow-up will be based on your lab results.

## 2014-10-13 NOTE — Progress Notes (Signed)
Pre visit review using our clinic review tool, if applicable. No additional management support is needed unless otherwise documented below in the visit note. 

## 2014-10-14 DIAGNOSIS — Z113 Encounter for screening for infections with a predominantly sexual mode of transmission: Secondary | ICD-10-CM | POA: Insufficient documentation

## 2014-10-14 LAB — RPR

## 2014-10-14 LAB — HIV ANTIBODY (ROUTINE TESTING W REFLEX): HIV: NONREACTIVE

## 2014-10-14 NOTE — Assessment & Plan Note (Signed)
Asymptomatic. Unclear risk due to unknown status of boyfriend who has been unfaithful. Will obtain STI panel. Will treat if anything abnormal.

## 2014-10-15 ENCOUNTER — Encounter: Payer: Self-pay | Admitting: Physician Assistant

## 2014-10-15 LAB — HSV(HERPES SMPLX)ABS-I+II(IGG+IGM)-BLD
HSV 2 Glycoprotein G Ab, IgG: <0.1 IV
Herpes Simplex Vrs I&II-IgM Ab (EIA): 1.39 INDEX — ABNORMAL HIGH

## 2014-10-15 LAB — URINE CYTOLOGY ANCILLARY ONLY
CHLAMYDIA, DNA PROBE: NEGATIVE
NEISSERIA GONORRHEA: NEGATIVE
Trichomonas: NEGATIVE

## 2014-10-18 ENCOUNTER — Other Ambulatory Visit: Payer: Self-pay | Admitting: Physician Assistant

## 2014-10-18 DIAGNOSIS — R899 Unspecified abnormal finding in specimens from other organs, systems and tissues: Secondary | ICD-10-CM

## 2014-10-20 ENCOUNTER — Encounter: Payer: Self-pay | Admitting: Physician Assistant

## 2014-10-20 LAB — URINE CYTOLOGY ANCILLARY ONLY
Bacterial vaginitis: NEGATIVE
CANDIDA VAGINITIS: NEGATIVE

## 2014-11-10 ENCOUNTER — Other Ambulatory Visit (INDEPENDENT_AMBULATORY_CARE_PROVIDER_SITE_OTHER): Payer: BLUE CROSS/BLUE SHIELD

## 2014-11-10 DIAGNOSIS — R899 Unspecified abnormal finding in specimens from other organs, systems and tissues: Secondary | ICD-10-CM

## 2014-11-11 ENCOUNTER — Encounter: Payer: Self-pay | Admitting: Physician Assistant

## 2014-11-11 ENCOUNTER — Telehealth: Payer: Self-pay | Admitting: Physician Assistant

## 2014-11-11 LAB — HSV(HERPES SMPLX)ABS-I+II(IGG+IGM)-BLD: HERPES SIMPLEX VRS I-IGM AB (EIA): 1.24 {index} — AB

## 2014-11-11 NOTE — Telephone Encounter (Signed)
Repeat testing has not resulted. Takes a couple of days. Will call once resulted!

## 2014-11-11 NOTE — Telephone Encounter (Signed)
Pt is calling in to see if her lab results are back. Please call back to advise.   CB#: [phone]

## 2015-02-20 ENCOUNTER — Encounter: Payer: Self-pay | Admitting: Physician Assistant

## 2015-02-21 ENCOUNTER — Other Ambulatory Visit (HOSPITAL_COMMUNITY)
Admission: RE | Admit: 2015-02-21 | Discharge: 2015-02-21 | Disposition: A | Discharge status: Home / Self Care | Payer: BLUE CROSS/BLUE SHIELD | Source: Ambulatory Visit | Attending: Physician Assistant | Admitting: Physician Assistant

## 2015-02-21 ENCOUNTER — Ambulatory Visit (INDEPENDENT_AMBULATORY_CARE_PROVIDER_SITE_OTHER): Payer: BLUE CROSS/BLUE SHIELD | Admitting: Physician Assistant

## 2015-02-21 ENCOUNTER — Encounter: Payer: Self-pay | Admitting: Physician Assistant

## 2015-02-21 VITALS — BP 130/82 | HR 94 | Temp 98.1°F | Ht 67.0 in | Wt 207.2 lb

## 2015-02-21 DIAGNOSIS — N760 Acute vaginitis: Secondary | ICD-10-CM | POA: Insufficient documentation

## 2015-02-21 DIAGNOSIS — A6000 Herpesviral infection of urogenital system, unspecified: Secondary | ICD-10-CM | POA: Diagnosis not present

## 2015-02-21 DIAGNOSIS — R8290 Unspecified abnormal findings in urine: Secondary | ICD-10-CM | POA: Diagnosis not present

## 2015-02-21 LAB — URINALYSIS, ROUTINE W REFLEX MICROSCOPIC
BILIRUBIN URINE: NEGATIVE
HGB URINE DIPSTICK: NEGATIVE
Ketones, ur: NEGATIVE
NITRITE: POSITIVE — AB
PH: 5.5 (ref 5.0–8.0)
Specific Gravity, Urine: 1.02 (ref 1.000–1.030)
TOTAL PROTEIN, URINE-UPE24: NEGATIVE
URINE GLUCOSE: NEGATIVE
UROBILINOGEN UA: 0.2 (ref 0.0–1.0)

## 2015-02-21 LAB — HIV ANTIBODY (ROUTINE TESTING W REFLEX): HIV 1&2 Ab, 4th Generation: NONREACTIVE

## 2015-02-21 LAB — RPR

## 2015-02-21 MED ORDER — VALACYCLOVIR HCL 1 G PO TABS: 1000.0000 mg | ORAL_TABLET | Freq: Two times a day (BID) | ORAL | Status: DC

## 2015-02-21 NOTE — Patient Instructions (Addendum)
Please go to the lab for blood work. I will call with your results. Please take the Valtrex as directed to help resolve this outbreak. Wear breathable undergarments. ES tylenol for pain.  No sexual activity until your workup is complete and this outbreak has resolved.  This is a more common viral illness than you know. Hopefully your body will get it under control and there will never be another outbreak. I recommend you start a daily vitamin.   Genital Herpes Genital herpes is a common sexually transmitted infection (STI) that is caused by a virus. The virus is spread from person to person through sexual contact. Infection can cause itching, blisters, and sores in the genital area or rectal area. This is called an outbreak. It affects both men and women. Genital herpes is particularly concerning for pregnant women because the virus can be passed to the baby during delivery and cause serious problems. Genital herpes is also a concern for people with a weakened defense (immune) system. Symptoms of genital herpes may last several days and then go away. However, the virus remains in your body, so you may have more outbreaks of symptoms in the future. The time between outbreaks varies and can be months or years. CAUSES Genital herpes is caused by a virus called herpes simplex virus (HSV) type 2 or HSV type 1. These viruses are contagious and are most often spread through sexual contact with an infected person. Sexual contact includes vaginal, anal, and oral sex. RISK FACTORS Risk factors for genital herpes include:  Being sexually active with multiple partners.  Having unprotected sex. SIGNS AND SYMPTOMS Symptoms may include:  Pain and itching in the genital area or rectal area.  Small red bumps that turn into blisters and then turn into sores.  Flu-like symptoms, including:  Fever.  Body aches.  Painful urination.  Vaginal discharge. DIAGNOSIS Genital herpes may be diagnosed  by:  Physical exam.  Blood test.  Fluid culture test from an open sore. TREATMENT There is no cure for genital herpes. Oral antiviral medicines may be used to speed up healing and to help prevent the return of symptoms. These medicines can also help to reduce the spread of the virus to sexual partners. HOME CARE INSTRUCTIONS  Keep the affected areas dry and clean.  Take medicines only as directed by your health care provider.  Do not have sexual contact during active infections. Genital herpes is contagious.  Practice safe sex. Latex condoms and female condoms may help to prevent the spread of the herpes virus.  Avoid rubbing or touching the blisters and sores. If you do touch the blister or sores:  Wash your hands thoroughly.  Do not touch your eyes afterward.  If you become pregnant, tell your health care provider if you have had genital herpes.  Keep all follow-up visits as directed by your health care provider. This is important. PREVENTION  Use condoms. Although anyone can contract genital herpes during sexual contact even with the use of a condom, a condom can provide some protection.  Avoid having multiple sexual partners.  Talk to your sexual partner about any symptoms and past history that either of you may have.  Get tested before you have sex. Ask your partner to do the same.  Recognize the symptoms of genital herpes. Do not have sexual contact if you notice these symptoms. SEEK MEDICAL CARE IF:  Your symptoms are not improving with medicine.  Your symptoms return.  You have new symptoms.  You  have a fever.  You have abdominal pain.  You have redness, swelling, or pain in your eye. MAKE SURE YOU:  Understand these instructions.  Will watch your condition.  Will get help right away if you are not doing well or get worse.   This information is not intended to replace advice given to you by your health care provider. Make sure you discuss any  questions you have with your health care provider.   Document Released: 02/17/2000 Document Revised: 03/12/2014 Document Reviewed: 07/07/2013 Elsevier Interactive Patient Education [employer]

## 2015-02-21 NOTE — Assessment & Plan Note (Signed)
Lesions present consistent with HSV. Will check full STI panel. Wet prep obtained. Will also check UA. Will begin Valtrex to suppress infection and help resolve outbreak. Will alter regimen based on results. Tylenol for pain. Discussed transmission of virus. NO sexual activity until outbreak is resolved. Will discuss prophylaxis at follow-up in 1 week.

## 2015-02-21 NOTE — Progress Notes (Signed)
Pre visit review using our clinic review tool, if applicable. No additional management support is needed unless otherwise documented below in the visit note. 

## 2015-02-21 NOTE — Progress Notes (Signed)
   Patient presents to clinic today c/o pelvic and suprapubic pain and pressure over the past 3 days after an episode of protected vaginal sex with her one female partner. Was seen at an UC yesterday and urine studies obtained. Patient states studies were inconclusive but she was started on Nitrofurantoin for UTI. Patient is taking as directed. Noted this morning that she has small itch and painful bumps on the outside of her vagina. Is concerned for STI. Denies other sexual partners.   Past Medical History  Diagnosis Date  . Anxiety   . Thyroid disease     Hashimoto's Thyroiditis    Current Outpatient Prescriptions on File Prior to Visit  Medication Sig Dispense Refill  . Misc Natural Products (MULTI-HERB PO) Take by mouth.    Marland Kitchen. NATURE-THROID 65 MG tablet     . omeprazole (PRILOSEC) 40 MG capsule TAKE 1 CAPSULE BY MOUTH DAILY 30 capsule 0   No current facility-administered medications on file prior to visit.    Allergies  Allergen Reactions  . Penicillins   . Amoxicillin Rash    Family History  Problem Relation Age of Onset  . Cancer Paternal Grandmother     Lymphoma  . Diabetes Paternal Grandfather     Social History   Social History  . Marital Status: Single    Spouse Name: N/A  . Number of Children: N/A  . Years of Education: N/A   Social History Main Topics  . Smoking status: Never Smoker   . Smokeless tobacco: Never Used  . Alcohol Use: Yes     Comment: socially  . Drug Use: No  . Sexual Activity: Yes     Comment: men-- on [place]   Other Topics Concern  . None   Social History Narrative   Review of Systems - See HPI.  All other ROS are negative.  BP 130/82 mmHg  Pulse 94  Temp(Src) 98.1 F (36.7 C) (Oral)  Ht 5\' 7"  (1.702 m)  Wt 207 lb 3.2 oz (93.985 kg)  BMI 32.44 kg/m2  SpO2 99%  LMP 02/10/2015  Physical Exam  Constitutional: She is oriented to person, place, and time and well-developed, well-nourished, and in no distress.  HENT:  Head:  Normocephalic and atraumatic.  Cardiovascular: Normal rate, regular rhythm, normal heart sounds and intact distal pulses.   Pulmonary/Chest: Effort normal and breath sounds normal. No respiratory distress. She has no wheezes. She has no rales. She exhibits no tenderness.  Genitourinary: Vulva exhibits lesion. Thin  odorless and vaginal discharge found.  Small shallow ulcerations noted of genitals and perineal region  Neurological: She is alert and oriented to person, place, and time.  Psychiatric:  Visibly anxious and teaful  Vitals reviewed.  No results found for this or any previous visit (from the past 2160 hour(s)).  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 Will check full STI panel. Wet prep obtained. Will also check UA. Will begin Valtrex to suppress infection and help resolve outbreak. Will alter regimen based on results. Tylenol for pain. Discussed transmission of virus. NO sexual activity until outbreak is resolved. Will discuss prophylaxis at follow-up in 1 week.

## 2015-02-22 ENCOUNTER — Encounter: Payer: Self-pay | Admitting: Physician Assistant

## 2015-02-22 ENCOUNTER — Ambulatory Visit: Payer: Self-pay | Admitting: Physician Assistant

## 2015-02-22 ENCOUNTER — Other Ambulatory Visit: Payer: Self-pay | Admitting: Physician Assistant

## 2015-02-22 LAB — CERVICOVAGINAL ANCILLARY ONLY: WET PREP (BD AFFIRM): NEGATIVE

## 2015-02-22 MED ORDER — TRAMADOL HCL 50 MG PO TABS: 50.0000 mg | ORAL_TABLET | Freq: Two times a day (BID) | ORAL | Status: DC | PRN

## 2015-02-22 NOTE — Addendum Note (Signed)
Addended by: Harley AltoPRICE, Jermayne Sweeney M on: 02/22/2015 09:12 AM   Modules accepted: Orders

## 2015-02-22 NOTE — Progress Notes (Signed)
[id number]

## 2015-02-23 LAB — HSV(HERPES SMPLX)ABS-I+II(IGG+IGM)-BLD
HERPES SIMPLEX VRS I-IGM AB (EIA): 1.99 {index} — AB
HSV 1 Glycoprotein G Ab, IgG: <0.1 IV
HSV 2 Glycoprotein G Ab, IgG: <0.1 IV

## 2015-02-23 LAB — URINE CULTURE
Colony Count: NO GROWTH
Organism ID, Bacteria: NO GROWTH

## 2015-02-24 ENCOUNTER — Encounter: Payer: Self-pay | Admitting: Physician Assistant

## 2015-03-11 ENCOUNTER — Encounter: Payer: Self-pay | Admitting: Physician Assistant

## 2015-03-11 ENCOUNTER — Other Ambulatory Visit: Payer: Self-pay | Admitting: Physician Assistant

## 2015-03-11 DIAGNOSIS — A6000 Herpesviral infection of urogenital system, unspecified: Secondary | ICD-10-CM

## 2015-03-11 MED ORDER — VALACYCLOVIR HCL 1 G PO TABS: 1000.0000 mg | ORAL_TABLET | Freq: Two times a day (BID) | ORAL | Status: DC

## 2015-03-31 ENCOUNTER — Encounter: Payer: Self-pay | Admitting: Physician Assistant

## 2015-03-31 DIAGNOSIS — A6000 Herpesviral infection of urogenital system, unspecified: Secondary | ICD-10-CM

## 2015-03-31 MED ORDER — VALACYCLOVIR HCL 1 G PO TABS: 1000.0000 mg | ORAL_TABLET | Freq: Two times a day (BID) | ORAL | Status: DC

## 2015-04-08 ENCOUNTER — Encounter: Payer: Self-pay | Admitting: Physician Assistant

## 2015-04-08 ENCOUNTER — Ambulatory Visit (INDEPENDENT_AMBULATORY_CARE_PROVIDER_SITE_OTHER): Payer: BLUE CROSS/BLUE SHIELD | Admitting: Physician Assistant

## 2015-04-08 VITALS — BP 113/77 | HR 84 | Temp 98.1°F | Ht 67.0 in | Wt 203.6 lb

## 2015-04-08 DIAGNOSIS — A6000 Herpesviral infection of urogenital system, unspecified: Secondary | ICD-10-CM

## 2015-04-08 DIAGNOSIS — E669 Obesity, unspecified: Secondary | ICD-10-CM

## 2015-04-08 DIAGNOSIS — M25511 Pain in right shoulder: Secondary | ICD-10-CM | POA: Diagnosis not present

## 2015-04-08 MED ORDER — ACYCLOVIR 400 MG PO TABS: 400.0000 mg | ORAL_TABLET | Freq: Two times a day (BID) | ORAL | Status: DC

## 2015-04-08 NOTE — Progress Notes (Signed)
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 Is well-treated with Valtrex but is still problematic due to frequency. Other STI panel was negative.   Patient c/o R shoulder pain described as aching that has been present off/on over the past several months that gets flared up with exercise regimen. Is seen by a chiropractor who has recommended PT. Has not taken anything for symptoms.  Patient requests referral to nutritionist due to weight. Body mass index is 31.88 kg/(m^2).  Past Medical History  Diagnosis Date  . Anxiety   . Thyroid disease     Hashimoto's Thyroiditis    Current Outpatient Prescriptions on File Prior to Visit  Medication Sig Dispense Refill  . Misc Natural Products (MULTI-HERB PO) Take by mouth.    . norgestimate-ethinyl estradiol (MONONESSA) 0.25-35 MG-MCG tablet Take 1 tablet by mouth daily.    Marland Kitchen omeprazole (PRILOSEC) 40 MG capsule TAKE 1 CAPSULE BY MOUTH DAILY 30 capsule 0   No current facility-administered medications on file prior to visit.    Allergies  Allergen Reactions  . Penicillins   . Amoxicillin Rash    Family History  Problem Relation Age of Onset  . Cancer Paternal Grandmother     Lymphoma  . Diabetes Paternal Grandfather     Social History   Social History  . Marital Status: Single    Spouse Name: N/A  . Number of Children: N/A  . Years of Education: N/A   Social History Main Topics  . Smoking status: Never Smoker   . Smokeless tobacco: Never Used  . Alcohol Use: Yes     Comment: socially  . Drug Use: No  . Sexual Activity: Yes     Comment: men-- on [place]   Other Topics Concern  . None   Social History Narrative   Review of Systems - See HPI.  All other ROS are negative.  BP 113/77 mmHg  Pulse 84  Temp(Src) 98.1 F (36.7 C) (Oral)  Ht 5\' 7"  (1.702 m)  Wt 203 lb 9.6 oz (92.352 kg)  BMI 31.88 kg/m2  SpO2 100%   LMP 03/09/2015  Physical Exam  Constitutional: She is oriented to person, place, and time and well-developed, well-nourished, and in no distress.  Cardiovascular: Normal rate, regular rhythm, normal heart sounds and intact distal pulses.   Pulmonary/Chest: Effort normal and breath sounds normal. No respiratory distress. She has no wheezes. She has no rales. She exhibits no tenderness.  Musculoskeletal:       Right shoulder: She exhibits tenderness. She exhibits normal range of motion, no bony tenderness and normal strength.  Neurological: She is alert and oriented to person, place, and time.  Skin: Skin is warm and dry. No rash noted.  Vitals reviewed.   Recent Results (from the past 2160 hour(s))  Cervicovaginal ancillary only     Status: None   Collection Time: 02/21/15 12:00 AM  Result Value Ref Range   Wet Prep (BD Affirm) Negative for Candida, Gardnerella, & Trichomonas     Comment: Normal Reference Range - Negative  HIV antibody     Status: None   Collection Time: 02/21/15 12:08 PM  Result Value Ref Range   HIV 1&2 Ab, 4th Generation NONREACTIVE NONREACTIVE    Comment:   HIV-1 antigen and HIV-1/HIV-2 antibodies were not detected.  There is no laboratory evidence of HIV infection.   HIV-1/2 Antibody Diff  Not indicated. HIV-1 RNA, Qual TMA          Not indicated.     PLEASE NOTE: This information has been disclosed to you from records whose confidentiality may be protected by state law. If your state requires such protection, then the state law prohibits you from making any further disclosure of the information without the specific written consent of the person to whom it pertains, or as otherwise permitted by law. A general authorization for the release of medical or other information is NOT sufficient for this purpose.   The performance of this assay has not been clinically validated in patients less than 63 years old.   For additional information please refer  to http://education.questdiagnostics.com/faq/FAQ106.  (This link is being provided for informational/educational purposes only.)     RPR     Status: None   Collection Time: 02/21/15 12:08 PM  Result Value Ref Range   RPR Ser Ql NON REAC NON REAC  HSV(herpes smplx)abs-1+2(IgG+IgM)-bld     Status: Abnormal   Collection Time: 02/21/15 12:08 PM  Result Value Ref Range   HSV 1 Glycoprotein G Ab, IgG <0.10 IV    Comment:      IV = Index Value              < 0.90 IV              Negative              0.90-1.10 IV           Equivocal              > 1.10 IV              Positive    HSV 2 Glycoprotein G Ab, IgG <0.10 IV    Comment:      IV = Index Value              < 0.90 IV              Negative              0.90-1.10 IV           Equivocal              > 1.10 IV              Positive    Herpes Simplex Vrs I&II-IgM Ab (EIA) 1.99 (H) INDEX    Comment:                 <=0.90 . . . . . . Marland Kitchen NEGATIVE               0.91-1.09. . . . . Marland Kitchen EQUIVOCAL               >=1.10 . . . . . . Marland Kitchen POSITIVE                                                                                                   The results obtained with the HSV 1 & 2 IgM test  should serve only as an aid to diagnosis and should not be interpreted as diagnostic by themselves. Heterotypic IgM antibody responses may occur in patients with a history of infection with other Herpes viruses, including Epstein-Barr virus and Varicella zoster virus, and give false positive results in HSV 1 & 2 IgM tests.  This test does not distinguish between HSV 1 and HSV 2.  A positive HSV IgM may indicate primary infection, but IgM antibody can persist 12 or more months after primary infection.  For confirmation, if clinically indicated, positive IgM results could be followed with the test for HSV 1 & 2 IgG glycoprotein (test code 16109) in 4-6 weeks.   Urinalysis, Routine w reflex microscopic     Status: Abnormal   Collection Time: 02/21/15 12:08 PM    Result Value Ref Range   Color, Urine YELLOW Yellow;Lt. Yellow   APPearance CLEAR Clear   Specific Gravity, Urine 1.020 1.000-1.030   pH 5.5 5.0 - 8.0   Total Protein, Urine NEGATIVE Negative   Urine Glucose NEGATIVE Negative   Ketones, ur NEGATIVE Negative   Bilirubin Urine NEGATIVE Negative   Hgb urine dipstick NEGATIVE Negative   Urobilinogen, UA 0.2 0.0 - 1.0   Leukocytes, UA SMALL (A) Negative   Nitrite POSITIVE (A) Negative   WBC, UA 7-10/hpf (A) 0-2/hpf   RBC / HPF 0-2/hpf 0-2/hpf   Squamous Epithelial / LPF Rare(0-4/hpf) Rare(0-4/hpf)   Bacteria, UA Few(10-50/hpf) (A) None  Urine culture     Status: None   Collection Time: 02/22/15  9:12 AM  Result Value Ref Range   Colony Count NO GROWTH    Organism ID, Bacteria NO GROWTH     Assessment/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 Follow-up 1 month.  Obesity Referral to nutrition placed. Discussed appropriate diet and exercise. Will follow.  Pain in joint of right shoulder Refer to PT for assessment and strengthening. RICE. Aleve daily.

## 2015-04-08 NOTE — Progress Notes (Signed)
Pre visit review using our clinic review tool, if applicable. No additional management support is needed unless otherwise documented below in the visit note. 

## 2015-04-08 NOTE — Assessment & Plan Note (Signed)
Refer to PT for assessment and strengthening. RICE. Aleve daily.

## 2015-04-08 NOTE — Assessment & Plan Note (Signed)
Will begin propylaxis with Acyclovir. Follow-up 1 month.

## 2015-04-08 NOTE — Patient Instructions (Signed)
Please start the Acyclovir twice daily once you have finished the course of Valtrex.  You will be contacted for nutrition appointment and for assessment by physical therapy.   Please use Aleve twice daily. No heavy lifting until PT assessment.  Follow-up with me in 1 month.

## 2015-04-08 NOTE — Assessment & Plan Note (Signed)
Referral to nutrition placed. Discussed appropriate diet and exercise. Will follow.

## 2015-04-25 ENCOUNTER — Encounter: Payer: Self-pay | Admitting: Rehabilitative and Restorative Service Providers"

## 2015-04-25 ENCOUNTER — Ambulatory Visit (INDEPENDENT_AMBULATORY_CARE_PROVIDER_SITE_OTHER): Payer: BLUE CROSS/BLUE SHIELD | Admitting: Rehabilitative and Restorative Service Providers"

## 2015-04-25 DIAGNOSIS — M629 Disorder of muscle, unspecified: Secondary | ICD-10-CM

## 2015-04-25 DIAGNOSIS — M623 Immobility syndrome (paraplegic): Secondary | ICD-10-CM

## 2015-04-25 DIAGNOSIS — M25511 Pain in right shoulder: Secondary | ICD-10-CM

## 2015-04-25 DIAGNOSIS — M2560 Stiffness of unspecified joint, not elsewhere classified: Secondary | ICD-10-CM

## 2015-04-25 DIAGNOSIS — R293 Abnormal posture: Secondary | ICD-10-CM

## 2015-04-25 DIAGNOSIS — R531 Weakness: Secondary | ICD-10-CM

## 2015-04-25 DIAGNOSIS — Z7409 Other reduced mobility: Secondary | ICD-10-CM

## 2015-04-25 DIAGNOSIS — M6289 Other specified disorders of muscle: Secondary | ICD-10-CM

## 2015-04-25 NOTE — Patient Instructions (Signed)
Self massage using ~4 inch rubber ball  Axial Extension (Chin Tuck)    Pull chin in and lengthen back of neck. Hold __10-15__ seconds while counting out loud. Repeat _5-10___ times. Do _several___ sessions per day.  Shoulder Blade Squeeze   Can use swim noodle along spine for tactile cue  Rotate shoulders back, then squeeze shoulder blades down and back. Hold 10 sec Repeat __10_ times. Do __several__ sessions per day.   Scapula Adduction With Pectoralis Stretch: Low - Standing   Shoulders at 45 hands even with shoulders, keeping weight through legs, shift weight forward until you feel pull or stretch through the front of your chest. Hold _30__ seconds. Do _3__ times, _2-4__ times per day.   Scapula Adduction With Pectoralis Stretch: Mid-Range - Standing   Shoulders at 90 elbows even with shoulders, keeping weight through legs, shift weight forward until you feel pull or strength through the front of your chest. Hold __30_ seconds. Do _3__ times, __2-4_ times per day.   Scapula Adduction With Pectoralis Stretch: High - Standing   Shoulders at 120 hands up high on the doorway, keeping weight on feet, shift weight forward until you feel pull or stretch through the front of your chest. Hold _30__ seconds. Do _3__ times, _2-3__ times per day.   TENS UNIT: This is helpful for muscle pain and spasm.   Search and Purchase a TENS 7000 2nd edition at www.tenspros.com. It should be less than $30.     TENS unit instructions: Do not shower or bathe with the unit on Turn the unit off before removing electrodes or batteries If the electrodes lose stickiness add a drop of water to the electrodes after they are disconnected from the unit and place on plastic sheet. If you continued to have difficulty, call the TENS unit company to purchase more electrodes. Do not apply lotion on the skin area prior to use. Make sure the skin is clean and dry as this will help prolong the life of  the electrodes. After use, always check skin for unusual red areas, rash or other skin difficulties. If there are any skin problems, does not apply electrodes to the same area. Never remove the electrodes from the unit by pulling the wires. Do not use the TENS unit or electrodes other than as directed. Do not change electrode placement without consultating your therapist or physician. Keep 2 fingers with between each electrode.   Trigger Point Dry Needling  . What is Trigger Point Dry Needling (DN)? o DN is a physical therapy technique used to treat muscle pain and dysfunction. Specifically, DN helps deactivate muscle trigger points (muscle knots).  o A thin filiform needle is used to penetrate the skin and stimulate the underlying trigger point. The goal is for a local twitch response (LTR) to occur and for the trigger point to relax. No medication of any kind is injected during the procedure.   . What Does Trigger Point Dry Needling Feel Like?  o The procedure feels different for each individual patient. Some patients report that they do not actually feel the needle enter the skin and overall the process is not painful. Very mild bleeding may occur. However, many patients feel a deep cramping in the muscle in which the needle was inserted. This is the local twitch response.   Marland Kitchen How Will I feel after the treatment? o Soreness is normal, and the onset of soreness may not occur for a few hours. Typically this soreness does not last  longer than two days.  o Bruising is uncommon, however; ice can be used to decrease any possible bruising.  o In rare cases feeling tired or nauseous after the treatment is normal. In addition, your symptoms may get worse before they get better, this period will typically not last longer than 24 hours.   . What Can I do After My Treatment? o Increase your hydration by drinking more water for the next 24 hours. o You may place ice or heat on the areas treated that  have become sore, however, do not use heat on inflamed or bruised areas. Heat often brings more relief post needling. o You can continue your regular activities, but vigorous activity is not recommended initially after the treatment for 24 hours. o DN is best combined with other physical therapy such as strengthening, stretching, and other therapies.

## 2015-04-25 NOTE — Therapy (Addendum)
[facility] [place], [place], [address] Phone: [phone]   Fax:  [phone]  Physical Therapy Evaluation  Patient Details  Name: Abigail Benjamin MRN: [id number] Date of Birth: Nov 21, 1985 Referring Provider: Tyrone Schimke [facility]  Encounter Date: 04/25/2015      PT End of Session - 04/25/15 0710    Visit Number 1   Number of Visits 12   Date for PT Re-Evaluation 06/06/15   PT Start Time 0715   PT Stop Time 0812   PT Time Calculation (min) 57 min   Activity Tolerance Patient tolerated treatment well      Past Medical History  Diagnosis Date  . Anxiety   . Thyroid disease     Hashimoto's Thyroiditis    Past Surgical History  Procedure Laterality Date  . Wisdom tooth extraction      There were no vitals filed for this visit.  Visit Diagnosis:  Shoulder pain, right - Plan: PT plan of care cert/re-cert  Abnormal posture - Plan: PT plan of care cert/re-cert  Muscular imbalance - Plan: PT plan of care cert/re-cert  Stiffness due to immobility - Plan: PT plan of care cert/re-cert  Decreased strength, endurance, and mobility - Plan: PT plan of care cert/re-cert      Subjective Assessment - 04/25/15 0717    Subjective Patient reports she she has been playing volleyball and softball since she was a child. She continues to play adult league softball. She has Rt shoulder pain with playing ball and now lifting her arm to put her hair up or reach overhead. Sympotms have been present for several years but increased over the past 6 months (during last softball season).    Pertinent History Lt hip pain for 3-4 months   How long can you sit comfortably? 1 hour    How long can you stand comfortably? no limit   How long can you walk comfortably? no limit   Patient Stated Goals improve ROM and to be able to start lifting weights; get rid of pain    Currently in Pain? Yes   Pain Score 5   8/10 with activity   Pain  Location Shoulder   Pain Orientation Right   Pain Descriptors / Indicators Aching;Tightness   Pain Type Chronic pain   Pain Radiating Towards ino the Rt neck;    Pain Onset More than a month ago   Pain Frequency Intermittent   Aggravating Factors  playing softball; any lifting; eliptical; lying on the Rt side; propping on Rt elbow   Pain Relieving Factors OTC antiinflammatory; ice; hot bath            [facility] PT Assessment - 04/25/15 0001    Assessment   Medical Diagnosis Rt shoulder pain    Referring Provider Tyrone Schimke [facility]   Onset Date/Surgical Date 11/04/14   Hand Dominance Right   Next MD Visit no return   Prior Therapy none   Precautions   Precautions --   Precaution Comments stop working out with upper body    Balance Screen   Has the patient fallen in the past 6 months No   Has the patient had a decrease in activity level because of a fear of falling?  No   Is the patient reluctant to leave their home because of a fear of falling?  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   Leisure exercise in gym 5 days/wk - cardio and wt lifting free wts and machines; zumba; hiking; household chores; walking dog    Observation/Other Assessments   Focus on Therapeutic Outcomes (FOTO)  56% limitation    Posture/Postural Control   Posture Comments head forward; shoulders rounded and elevated; head of the humerus anterior in orientation; scapulae abducted and rotated along the thoracic wall; Rt > Lt    AROM   Right Shoulder Extension 43 Degrees   Right Shoulder Flexion 144 Degrees   Right Shoulder ABduction 110 Degrees   Right Shoulder Internal Rotation 16 Degrees   Right Shoulder External Rotation 95 Degrees   Left Shoulder Extension 57 Degrees   Left Shoulder Flexion 157 Degrees   Left Shoulder ABduction 157 Degrees   Left Shoulder Internal Rotation 29 Degrees   Left Shoulder External  Rotation 93 Degrees   Cervical Flexion 56   Cervical Extension 38   Cervical - Right Side Bend 32   Cervical - Left Side Bend 25   Cervical - Right Rotation 55   Cervical - Left Rotation 52   Strength   Overall Strength Comments 5/5 bilat UE's except Rt shd flex 4/5 and abd 4+/5 and painful    Palpation   Palpation comment significant tightness through pecs; upper trap; leveator; ant/lat/post cervical musculature Rt >> Lt    Special Tests    Special Tests --  empty can (-)                   [facility] Adult PT Treatment/Exercise - 04/25/15 0001    Therapeutic Activites    Therapeutic Activities --  myofacila ball release work   Neuro Re-ed    Neuro Re-ed Details  postural correction/work   Shoulder Exercises: Standing   Other Standing Exercises scap squeeze with noodle 10 sec x 10    Shoulder Exercises: Stretch   Other Shoulder Stretches 3 way doorway stretch 30 sec x 2    Other Shoulder Stretches axial extension 10 sec x 5    Moist Heat Therapy   Number Minutes Moist Heat 18 Minutes   Moist Heat Location Cervical;Shoulder  Rt   Electrical Stimulation   Electrical Stimulation Location Rt cervical and shoulder areas   Electrical Stimulation Action IFC   Electrical Stimulation Parameters to tolerance   Electrical Stimulation Goals Pain;Tone                PT Education - 04/25/15 0756    Education provided Yes   Education Details Myofacial ball release work;  HEP; TENS and TDN info    Person(s) Educated Patient   Methods Explanation;Demonstration;Tactile cues;Verbal cues;Handout   Comprehension Verbalized understanding;Returned demonstration;Verbal cues required;Tactile cues required             PT Long Term Goals - 04/25/15 1110    PT LONG TERM GOAL #1   Title Improve posture and alignment with patient to demo upright posture and scapulae in good alignment along thoracic spine and GH joint aligned 06/06/15   Time 6   Period Weeks   Status New   PT  LONG TERM GOAL #2   Title Improve AROM Rt shd to equal or greater than AROM Lt shd 06/06/15   Time 6   Period Weeks   Status New   PT LONG TERM GOAL #3   Title 5/5 strnegth Rt shd with no pain 06/06/15   Time 6   Period Weeks   Status New  PT LONG TERM GOAL #4   Title Pt able to participate in appropriate exercise program at gym without pain and with correct program to avoid irritation of Rt shoulder 06/06/15   Time 6   Period Weeks   Status New   PT LONG TERM GOAL #5   Title I In HEP 06/06/15   Time 6   Period Weeks   Status New   PT LONG TERM GOAL #6   Title Improve FOTO tpo </=33% limitation 06/06/15   Time 6   Period Weeks   Status New               Plan - 04/25/15 1106    Clinical Impression Statement Patient presents with long standing Rt shoulder pain and dysfunction with increased symptoms in the past 9-12 months. She has limited ROM and pain with resistive strength testing as well as pain and muscular tightness with palpation. She has poor posture and alignment. Pt will benefit form PT to address problems identified.    Pt will benefit from skilled therapeutic intervention in order to improve on the following deficits Postural dysfunction;Improper body mechanics;Decreased range of motion;Decreased mobility;Increased fascial restricitons;Decreased endurance;Decreased activity tolerance;Pain   Rehab Potential Good   PT Frequency 2x / week   PT Duration 6 weeks   PT Treatment/Interventions Patient/family education;ADLs/Self Care Home Management;Therapeutic exercise;Therapeutic activities;Manual techniques;Neuromuscular re-education;Dry needling;Cryotherapy;Electrical Stimulation;Iontophoresis 63m/ml Dexamethasone;Moist Heat;Ultrasound   PT Next Visit Plan postural correction; supine pec stretch; cont stretching anterior structures; add posterior shoulder girdle strengthening; manual work; dry needling    PT Home Exercise Plan postural correction; myofacial ball release work;  HEP; work on pData processing managerand Agree with Plan of Care Patient         Problem List Patient Active Problem List   Diagnosis Date Noted  . Obesity 04/08/2015  . Pain in joint of right shoulder 04/08/2015  . Genital HSV 02/21/2015  . Gastroesophageal reflux disease with esophagitis 11/05/2013  . Generalized anxiety disorder 11/05/2013  . Hashimoto's thyroiditis 11/05/2013  . Contraception 11/05/2013    Jacksyn Beeks PNilda SimmerPT, MPH  04/25/2015, 11:18 AM  [facility] [address]Phone: [phone]  Fax:  [phone] Name: Abigail DULLEAMRN: [id number] of Birth: [birth year]  PHYSICAL THERAPY DISCHARGE SUMMARY  Visits from Start of Care: eval only   Current functional level related to goals / functional outcomes: Unknown   Remaining deficits: unknown   Education / Equipment: Initial HEP  Plan: Patient agrees to discharge.  Patient goals were not met. Patient is being discharged due to not returning since the last visit.  ?????    Jaeger Trueheart P. HHelene KelpPT, MPH 05/25/2015 8:14 AM

## 2015-05-02 ENCOUNTER — Encounter: Payer: Self-pay | Admitting: Rehabilitative and Restorative Service Providers"

## 2015-05-05 ENCOUNTER — Encounter: Payer: Self-pay | Admitting: Rehabilitative and Restorative Service Providers"

## 2015-05-06 ENCOUNTER — Encounter: Payer: Self-pay | Admitting: Physician Assistant

## 2015-05-09 ENCOUNTER — Encounter: Payer: Self-pay | Admitting: Physical Therapy

## 2015-05-09 ENCOUNTER — Encounter: Payer: Self-pay | Admitting: Physician Assistant

## 2015-05-09 ENCOUNTER — Ambulatory Visit (INDEPENDENT_AMBULATORY_CARE_PROVIDER_SITE_OTHER): Payer: BLUE CROSS/BLUE SHIELD | Admitting: Physician Assistant

## 2015-05-09 VITALS — BP 120/78 | HR 95 | Temp 98.3°F | Ht 67.0 in | Wt 214.4 lb

## 2015-05-09 DIAGNOSIS — E669 Obesity, unspecified: Secondary | ICD-10-CM | POA: Diagnosis not present

## 2015-05-09 LAB — TSH: TSH: 4.89 u[IU]/mL — AB (ref 0.35–4.50)

## 2015-05-09 MED ORDER — PHENTERMINE HCL 30 MG PO CAPS: 30.0000 mg | ORAL_CAPSULE | ORAL | Status: DC

## 2015-05-09 NOTE — Patient Instructions (Addendum)
Please go to the lab for blood work. I will call you with your results.  Please continue diet and exercise regimen. Take the phentermine daily as directed.  Follow-up with the nurse in 4 weeks to recheck weight, BP and pulse. If you notice any side effects of medication, stop it and call or come see me.  Follow-up with men in 3 months.

## 2015-05-09 NOTE — Progress Notes (Signed)
Pre visit review using our clinic review tool, if applicable. No additional management support is needed unless otherwise documented below in the visit note. 

## 2015-05-09 NOTE — Assessment & Plan Note (Signed)
Will obtain TSH today. Continue diet and exercise regimen. Will start phentermine 30 mg daily. Follow-up for BP check in 1 month with RN. Follow-up with me 3 months. She is to stop medication and call if she notes any side effect.

## 2015-05-09 NOTE — Progress Notes (Signed)
Patient presents to clinic today to discuss weight management. Patient with Body mass index is 33.57 kg/(m^2). Is exercising 6 days a week -- 3 days of cardio for an hour and 3 days of resistance training. Is hiking on weekends when weather permits. Has been attending weight watcher meetings and following a calorie-restricted diet. Is still gaining weight despite all of these measures. Has history of hashimoto's at early age. Previously TSH within normal limits.   Past Medical History  Diagnosis Date  . Anxiety   . Thyroid disease     Hashimoto's Thyroiditis    Current Outpatient Prescriptions on File Prior to Visit  Medication Sig Dispense Refill  . acyclovir (ZOVIRAX) 400 MG tablet Take 1 tablet (400 mg total) by mouth 2 (two) times daily. 60 tablet 0  . Misc Natural Products (MULTI-HERB PO) Take by mouth.    Marland Kitchen. NATURE-THROID 97.5 MG TABS TAKE 1 TABLET BY MOUTH EVERY MORNING ON AN EMPTY STOMACH.    . norgestimate-ethinyl estradiol (MONONESSA) 0.25-35 MG-MCG tablet Take 1 tablet by mouth daily.    Marland Kitchen. omeprazole (PRILOSEC) 40 MG capsule TAKE 1 CAPSULE BY MOUTH DAILY 30 capsule 0   No current facility-administered medications on file prior to visit.    Allergies  Allergen Reactions  . Penicillins   . Amoxicillin Rash    Family History  Problem Relation Age of Onset  . Cancer Paternal Grandmother     Lymphoma  . Diabetes Paternal Grandfather     Social History   Social History  . Marital Status: Single    Spouse Name: N/A  . Number of Children: N/A  . Years of Education: N/A   Social History Main Topics  . Smoking status: Never Smoker   . Smokeless tobacco: Never Used  . Alcohol Use: Yes     Comment: socially  . Drug Use: No  . Sexual Activity: Yes     Comment: men-- on [place]   Other Topics Concern  . None   Social History Narrative    Review of Systems - See HPI.  All other ROS are negative.  BP 120/78 mmHg  Pulse 95  Temp(Src) 98.3 F (36.8 C) (Oral)   Ht 5\' 7"  (1.702 m)  Wt 214 lb 6.4 oz (97.251 kg)  BMI 33.57 kg/m2  SpO2 100%  LMP 05/07/2015  Physical Exam  Constitutional: She is oriented to person, place, and time and well-developed, well-nourished, and in no distress.  HENT:  Head: Normocephalic and atraumatic.  Eyes: Conjunctivae are normal.  Neck: Neck supple.  Cardiovascular: Normal rate, regular rhythm, normal heart sounds and intact distal pulses.   Pulmonary/Chest: Effort normal and breath sounds normal. No respiratory distress. She has no wheezes. She has no rales. She exhibits no tenderness.  Neurological: She is alert and oriented to person, place, and time.  Skin: Skin is warm and dry. No rash noted.  Psychiatric: Affect normal.  Vitals reviewed.   Recent Results (from the past 2160 hour(s))  Cervicovaginal ancillary only     Status: None   Collection Time: 02/21/15 12:00 AM  Result Value Ref Range   Wet Prep (BD Affirm) Negative for Candida, Gardnerella, & Trichomonas     Comment: Normal Reference Range - Negative  HIV antibody     Status: None   Collection Time: 02/21/15 12:08 PM  Result Value Ref Range   HIV 1&2 Ab, 4th Generation NONREACTIVE NONREACTIVE    Comment:   HIV-1 antigen and HIV-1/HIV-2 antibodies were not detected.  There is no laboratory evidence of HIV infection.   HIV-1/2 Antibody Diff        Not indicated. HIV-1 RNA, Qual TMA          Not indicated.     PLEASE NOTE: This information has been disclosed to you from records whose confidentiality may be protected by state law. If your state requires such protection, then the state law prohibits you from making any further disclosure of the information without the specific written consent of the person to whom it pertains, or as otherwise permitted by law. A general authorization for the release of medical or other information is NOT sufficient for this purpose.   The performance of this assay has not been clinically validated in patients  less than 81 years old.   For additional information please refer to http://education.quest[employer].com/faq/FAQ106.  (This link is being provided for informational/educational purposes only.)     RPR     Status: None   Collection Time: 02/21/15 12:08 PM  Result Value Ref Range   RPR Ser Ql NON REAC NON REAC  HSV(herpes smplx)abs-1+2(IgG+IgM)-bld     Status: Abnormal   Collection Time: 02/21/15 12:08 PM  Result Value Ref Range   HSV 1 Glycoprotein G Ab, IgG <0.10 IV    Comment:      IV = Index Value              < 0.90 IV              Negative              0.90-1.10 IV           Equivocal              > 1.10 IV              Positive    HSV 2 Glycoprotein G Ab, IgG <0.10 IV    Comment:      IV = Index Value              < 0.90 IV              Negative              0.90-1.10 IV           Equivocal              > 1.10 IV              Positive    Herpes Simplex Vrs I&II-IgM Ab (EIA) 1.99 (H) INDEX    Comment:                 <=0.90 . . . . . . Marland Kitchen NEGATIVE               0.91-1.09. . . . . Marland Kitchen EQUIVOCAL               >=1.10 . . . . . . Marland Kitchen POSITIVE  The results obtained with the HSV 1 & 2 IgM test should serve only as an aid to diagnosis and should not be interpreted as diagnostic by themselves. Heterotypic IgM antibody responses may occur in patients with a history of infection with other Herpes viruses, including Epstein-Barr virus and Varicella zoster virus, and give false positive results in HSV 1 & 2 IgM tests.  This test does not distinguish between HSV 1 and HSV 2.  A positive HSV IgM may indicate primary infection, but IgM antibody can persist 12 or more months after primary infection.  For confirmation, if clinically indicated, positive IgM results could be followed with the test for HSV 1 & 2 IgG glycoprotein (test code 16109) in 4-6 weeks.   Urinalysis, Routine w reflex  microscopic     Status: Abnormal   Collection Time: 02/21/15 12:08 PM  Result Value Ref Range   Color, Urine YELLOW Yellow;Lt. Yellow   APPearance CLEAR Clear   Specific Gravity, Urine 1.020 1.000-1.030   pH 5.5 5.0 - 8.0   Total Protein, Urine NEGATIVE Negative   Urine Glucose NEGATIVE Negative   Ketones, ur NEGATIVE Negative   Bilirubin Urine NEGATIVE Negative   Hgb urine dipstick NEGATIVE Negative   Urobilinogen, UA 0.2 0.0 - 1.0   Leukocytes, UA SMALL (A) Negative   Nitrite POSITIVE (A) Negative   WBC, UA 7-10/hpf (A) 0-2/hpf   RBC / HPF 0-2/hpf 0-2/hpf   Squamous Epithelial / LPF Rare(0-4/hpf) Rare(0-4/hpf)   Bacteria, UA Few(10-50/hpf) (A) None  Urine culture     Status: None   Collection Time: 02/22/15  9:12 AM  Result Value Ref Range   Colony Count NO GROWTH    Organism ID, Bacteria NO GROWTH     Assessment/Plan: Obesity Will obtain TSH today. Continue diet and exercise regimen. Will start phentermine 30 mg daily. Follow-up for BP check in 1 month with RN. Follow-up with me 3 months. She is to stop medication and call if she notes any side effect.

## 2015-05-10 NOTE — Telephone Encounter (Signed)
Jasmine DecemberSharon will you call insurance to help me figure this out. It is a form for phentermine. See MyChart message.

## 2015-05-11 ENCOUNTER — Encounter: Payer: Self-pay | Admitting: Physical Therapy

## 2015-05-12 ENCOUNTER — Telehealth: Payer: Self-pay | Admitting: Physician Assistant

## 2015-05-12 NOTE — Telephone Encounter (Signed)
Has the patient called the pharmacy and had them fax over a PA? This is the first step. If she has not done this than she needs to. Please inform patient of this.  Also there are numerous PA that come through the office. We will fill out as soon as we receive the form but it canl take up to a week after submitting to get a response from insurance since this is not a life-saving medication.

## 2015-05-12 NOTE — Telephone Encounter (Signed)
Patient called to check the status of the PA for phentermine 30 MG capsule [[id number]][[id number]] States [employer] told her that the process had not been started. Plse adv patient [phone].

## 2015-05-15 ENCOUNTER — Encounter: Payer: Self-pay | Admitting: Physician Assistant

## 2015-05-16 ENCOUNTER — Encounter: Payer: Self-pay | Admitting: Physical Therapy

## 2015-05-16 NOTE — Telephone Encounter (Signed)
Please see MyChart messages -- I want to know if patient is still seeing her Endocrinologist at [facility] due to recent TSH results.

## 2015-05-16 NOTE — Telephone Encounter (Signed)
Called and spoke with the pt and [facility] and she will be see her Endocrinologist at [facility].//AB/CMA

## 2015-05-18 ENCOUNTER — Encounter: Payer: Self-pay | Admitting: Physical Therapy

## 2015-06-10 ENCOUNTER — Ambulatory Visit: Payer: Self-pay | Admitting: Physician Assistant

## 2015-07-19 ENCOUNTER — Other Ambulatory Visit: Payer: Self-pay | Admitting: Physician Assistant

## 2015-07-19 DIAGNOSIS — A6000 Herpesviral infection of urogenital system, unspecified: Secondary | ICD-10-CM

## 2015-07-19 MED ORDER — ACYCLOVIR 400 MG PO TABS
400.0000 mg | ORAL_TABLET | Freq: Two times a day (BID) | ORAL | Status: DC
Start: 2015-07-19 — End: 2017-01-03

## 2015-07-19 NOTE — Telephone Encounter (Signed)
Duplicate request

## 2015-07-21 ENCOUNTER — Ambulatory Visit (INDEPENDENT_AMBULATORY_CARE_PROVIDER_SITE_OTHER): Payer: BLUE CROSS/BLUE SHIELD | Admitting: Licensed Clinical Social Worker

## 2015-07-21 DIAGNOSIS — F419 Anxiety disorder, unspecified: Secondary | ICD-10-CM

## 2015-07-28 ENCOUNTER — Encounter: Payer: Self-pay | Admitting: Physician Assistant

## 2015-08-04 ENCOUNTER — Ambulatory Visit (INDEPENDENT_AMBULATORY_CARE_PROVIDER_SITE_OTHER): Payer: BLUE CROSS/BLUE SHIELD | Admitting: Licensed Clinical Social Worker

## 2015-08-04 DIAGNOSIS — F419 Anxiety disorder, unspecified: Secondary | ICD-10-CM | POA: Diagnosis not present

## 2015-09-01 ENCOUNTER — Ambulatory Visit: Payer: BLUE CROSS/BLUE SHIELD | Admitting: Licensed Clinical Social Worker

## 2015-11-29 ENCOUNTER — Telehealth: Payer: Self-pay | Admitting: Physician Assistant

## 2015-11-29 NOTE — Telephone Encounter (Signed)
Yes she can have at appointment.

## 2015-11-29 NOTE — Telephone Encounter (Signed)
Patient was notified by MyChart that she is due for her tetanus shot. She is coming in on Monday 12/05/15 at 3:15 [facility]. Can she get her tetanus shot then?  Patient Relation: Self Patient Phone: [phone]

## 2015-12-05 ENCOUNTER — Ambulatory Visit (INDEPENDENT_AMBULATORY_CARE_PROVIDER_SITE_OTHER): Payer: BLUE CROSS/BLUE SHIELD | Admitting: Physician Assistant

## 2015-12-05 ENCOUNTER — Encounter: Payer: Self-pay | Admitting: Physician Assistant

## 2015-12-05 VITALS — BP 121/81 | HR 82 | Temp 98.1°F | Resp 16 | Ht 67.0 in | Wt 232.2 lb

## 2015-12-05 DIAGNOSIS — F329 Major depressive disorder, single episode, unspecified: Secondary | ICD-10-CM | POA: Diagnosis not present

## 2015-12-05 DIAGNOSIS — R4589 Other symptoms and signs involving emotional state: Secondary | ICD-10-CM | POA: Insufficient documentation

## 2015-12-05 DIAGNOSIS — Z23 Encounter for immunization: Secondary | ICD-10-CM

## 2015-12-05 MED ORDER — BUPROPION HCL ER (XL) 150 MG PO TB24: 150.0000 mg | ORAL_TABLET | Freq: Every day | ORAL | 1 refills | Status: DC

## 2015-12-05 NOTE — Patient Instructions (Signed)
Please start the Wellbutrin as directed daily. Keep well hydrated and eat a well-balance diet. Try to start meal prepping as directed to help make sure you are getting the proper nutrients in your diet. Keep up with 7-8 hours of sleep per night. Work on increasing exercise to promote improved mood and better sleep.  Follow-up in 4-6 weeks. If you note any worsening mood on medication, stop the medicine and come see me ASAP. If you have any thoughts of harming yourself or others, please call 911 or go to the nearest ER.

## 2015-12-05 NOTE — Progress Notes (Signed)
Patient presents to clinic today for discussion of depressed mood and stressors. Patient endorses depressed mood with anhedonia. Notes very poor eating habits at present. Is sleeping well but does not feel rested. Denies SI/HI. Notes major changes at work over the past couple of months. One of her colleagues was fired and she is taking on their work in addition to hers. Is averaging about 60-70 hours of work per week. Endorses little time for healthy meals or exercise. Body mass index is 36.38 kg/m.  Past Medical History:  Diagnosis Date  . Anxiety   . Thyroid disease    Hashimoto's Thyroiditis    Current Outpatient Prescriptions on File Prior to Visit  Medication Sig Dispense Refill  . acyclovir (ZOVIRAX) 400 MG tablet Take 1 tablet (400 mg total) by mouth 2 (two) times daily. (Patient taking differently: Take 400 mg by mouth 2 (two) times daily as needed. ) 60 tablet 0  . Misc Natural Products (MULTI-HERB PO) Take by mouth.    Marland Kitchen. NATURE-THROID 97.5 MG TABS TAKE 1 TABLET BY MOUTH EVERY MORNING ON AN EMPTY STOMACH.    . norgestimate-ethinyl estradiol (MONONESSA) 0.25-35 MG-MCG tablet Take 1 tablet by mouth daily.    Marland Kitchen. omeprazole (PRILOSEC) 40 MG capsule TAKE 1 CAPSULE BY MOUTH DAILY 30 capsule 0   No current facility-administered medications on file prior to visit.     Allergies  Allergen Reactions  . Penicillins   . Amoxicillin Rash    Family History  Problem Relation Age of Onset  . Cancer Paternal Grandmother     Lymphoma  . Diabetes Paternal Grandfather     Social History   Social History  . Marital status: Single    Spouse name: N/A  . Number of children: N/A  . Years of education: N/A   Social History Main Topics  . Smoking status: Never Smoker  . Smokeless tobacco: Never Used  . Alcohol use Yes     Comment: socially  . Drug use: No  . Sexual activity: Yes     Comment: men-- on [place]   Other Topics Concern  . None   Social History Narrative  . None     Review of Systems - See HPI.  All other ROS are negative.  BP 121/81 (BP Location: Left Arm, Patient Position: Sitting, Cuff Size: Large)   Pulse 82   Temp 98.1 F (36.7 C) (Oral)   Resp 16   Ht 5\' 7"  (1.702 m)   Wt 232 lb 4 oz (105.3 kg)   LMP 11/21/2015   SpO2 98%   BMI 36.38 kg/m   Physical Exam  Constitutional: She is oriented to person, place, and time and well-developed, well-nourished, and in no distress.  HENT:  Head: Normocephalic and atraumatic.  Eyes: Conjunctivae are normal.  Neck: Neck supple.  Cardiovascular: Normal rate, regular rhythm, normal heart sounds and intact distal pulses.   Pulmonary/Chest: Effort normal and breath sounds normal. No respiratory distress. She has no wheezes. She has no rales. She exhibits no tenderness.  Neurological: She is alert and oriented to person, place, and time.  Skin: Skin is warm and dry. No rash noted.  Psychiatric: Affect and judgment normal. Her mood appears not anxious. She exhibits a depressed mood. She expresses no homicidal and no suicidal ideation.  Vitals reviewed.  Assessment/Plan: Depressed mood No alarm signs/symptoms present. 2/2 major changes recently. Reviewed supportive measures. She is encouraged to follow-up with her counselor. Will begin Wellbutrin XL 150 mg daily.  FU scheduled. Reviewed alarm signs/symptoms with patient that would prompt ER assessment. Patient voices understanding and agreement with the plan.    Piedad Climes, PA-C

## 2015-12-05 NOTE — Assessment & Plan Note (Signed)
No alarm signs/symptoms present. 2/2 major changes recently. Reviewed supportive measures. She is encouraged to follow-up with her counselor. Will begin Wellbutrin XL 150 mg daily. FU scheduled. Reviewed alarm signs/symptoms with patient that would prompt ER assessment. Patient voices understanding and agreement with the plan.

## 2015-12-05 NOTE — Progress Notes (Signed)
Pre visit review using our clinic review tool, if applicable. No additional management support is needed unless otherwise documented below in the visit note/SLS  

## 2015-12-13 ENCOUNTER — Encounter: Payer: Self-pay | Admitting: Physician Assistant

## 2016-01-01 NOTE — Progress Notes (Signed)
Patient presents to clinic today for follow-up of anxiety and depression. At last visit, patient was started on Wellbutrin XL 150 mg daily. Endorses taking as directed. Initially noted some mild sweating with 1st couple of doses of medication that since resolved. Denies residual side effects. Endorses improvement in mood with medication. Is less tearful and able to concentrate at work. Denies new or worsening symptoms. Patient is working with her boss to cut down on her workload as she is working 60 hours per week at present.  Patient also endorses being evaluated and treated at [facility] 2 weeks ago for sinusitis and bronchitis. Endorses she took Doxycycline for a week. Endorses sinus symptoms improved but chest congestion and productive cough are still present. Denies fever, chills or chest pain. Is taking Mucinex that is helping her get mucous out. Also notes itching and watery eyes. Is taking Zyrtec with little relief.   Past Medical History:  Diagnosis Date  . Anxiety   . Thyroid disease    Hashimoto's Thyroiditis    Current Outpatient Prescriptions on File Prior to Visit  Medication Sig Dispense Refill  . acyclovir (ZOVIRAX) 400 MG tablet Take 1 tablet (400 mg total) by mouth 2 (two) times daily. (Patient taking differently: Take 400 mg by mouth 2 (two) times daily as needed. ) 60 tablet 0  . buPROPion (WELLBUTRIN XL) 150 MG 24 hr tablet Take 1 tablet (150 mg total) by mouth daily. 30 tablet 1  . Misc Natural Products (MULTI-HERB PO) Take by mouth.    . norgestimate-ethinyl estradiol (MONONESSA) 0.25-35 MG-MCG tablet Take 1 tablet by mouth daily.    Marland Kitchen. omeprazole (PRILOSEC) 40 MG capsule TAKE 1 CAPSULE BY MOUTH DAILY 30 capsule 0  . NATURE-THROID 97.5 MG TABS TAKE 1 TABLET BY MOUTH EVERY MORNING ON AN EMPTY STOMACH.     No current facility-administered medications on file prior to visit.     Allergies  Allergen Reactions  . Penicillins   . Amoxicillin Rash    Family History  Problem  Relation Age of Onset  . Cancer Paternal Grandmother     Lymphoma  . Diabetes Paternal Grandfather     Social History   Social History  . Marital status: Single    Spouse name: N/A  . Number of children: N/A  . Years of education: N/A   Social History Main Topics  . Smoking status: Never Smoker  . Smokeless tobacco: Never Used  . Alcohol use Yes     Comment: socially  . Drug use: No  . Sexual activity: Yes     Comment: men-- on [place]   Other Topics Concern  . None   Social History Narrative  . None   Review of Systems - See HPI.  All other ROS are negative.  BP 108/84 (BP Location: Left Arm, Patient Position: Sitting, Cuff Size: Large)   Pulse 94   Temp 98 F (36.7 C) (Oral)   Resp 16   Ht 5\' 7"  (1.702 m)   Wt 231 lb 6 oz (105 kg)   LMP 12/04/2015   SpO2 99%   BMI 36.24 kg/m   Physical Exam  Constitutional: She is oriented to person, place, and time and well-developed, well-nourished, and in no distress.  HENT:  Head: Normocephalic and atraumatic.  Right Ear: External ear normal.  Left Ear: External ear normal.  Nose: Nose normal.  Mouth/Throat: Oropharynx is clear and moist. No oropharyngeal exudate.  Eyes: Conjunctivae are normal. Pupils are equal, round, and  reactive to light.  Cardiovascular: Normal rate, regular rhythm, normal heart sounds and intact distal pulses.   Pulmonary/Chest: Effort normal and breath sounds normal. No respiratory distress. She has no wheezes. She has no rales. She exhibits no tenderness.  Neurological: She is alert and oriented to person, place, and time.  Skin: Skin is warm and dry. No rash noted.  Psychiatric: Affect normal.  Vitals reviewed.  Assessment/Plan: Depressed mood Improved with Wellbutrin. Patient is working on life stressors. Continue current regimen. FU 6 months.  Acute bacterial bronchitis Rx Azithromycin.  Increase fluids.  Rest.  Saline nasal spray.  Probiotic.  Mucinex as directed.  Humidifier in  bedroom. Tessalon for cough.  Call or return to clinic if symptoms are not improving.     Piedad ClimesMartin, Ko B[place], PA-C

## 2016-01-02 ENCOUNTER — Ambulatory Visit (INDEPENDENT_AMBULATORY_CARE_PROVIDER_SITE_OTHER): Payer: BLUE CROSS/BLUE SHIELD | Admitting: Physician Assistant

## 2016-01-02 ENCOUNTER — Encounter: Payer: Self-pay | Admitting: Physician Assistant

## 2016-01-02 VITALS — BP 108/84 | HR 94 | Temp 98.0°F | Resp 16 | Ht 67.0 in | Wt 231.4 lb

## 2016-01-02 DIAGNOSIS — B9689 Other specified bacterial agents as the cause of diseases classified elsewhere: Secondary | ICD-10-CM | POA: Diagnosis not present

## 2016-01-02 DIAGNOSIS — F329 Major depressive disorder, single episode, unspecified: Secondary | ICD-10-CM

## 2016-01-02 DIAGNOSIS — R4589 Other symptoms and signs involving emotional state: Secondary | ICD-10-CM

## 2016-01-02 DIAGNOSIS — J208 Acute bronchitis due to other specified organisms: Secondary | ICD-10-CM | POA: Diagnosis not present

## 2016-01-02 MED ORDER — BENZONATATE 100 MG PO CAPS: 100.0000 mg | ORAL_CAPSULE | Freq: Three times a day (TID) | ORAL | 0 refills | Status: AC | PRN

## 2016-01-02 MED ORDER — AZITHROMYCIN 250 MG PO TABS: ORAL_TABLET | ORAL | 0 refills | Status: AC

## 2016-01-02 NOTE — Assessment & Plan Note (Signed)
Improved with Wellbutrin. Patient is working on life stressors. Continue current regimen. FU 6 months.

## 2016-01-02 NOTE — Progress Notes (Signed)
Pre visit review using our clinic review tool, if applicable. No additional management support is needed unless otherwise documented below in the visit note/SLS  

## 2016-01-02 NOTE — Assessment & Plan Note (Signed)
Rx Azithromycin.  Increase fluids.  Rest.  Saline nasal spray.  Probiotic.  Mucinex as directed.  Humidifier in bedroom. Tessalon for cough.  Call or return to clinic if symptoms are not improving.  

## 2016-01-02 NOTE — Patient Instructions (Addendum)
Please continue Wellbutrin as directed. I am glad you are doing so well. Follow-up in 6 months.   Take antibiotic (Azithromycin) as directed.  Increase fluids.  Get plenty of rest. Use Mucinex for congestion. Tessalon for cough. For itch eyes -- over the counter Alaway is a good option. Take a daily probiotic (I recommend Align or Culturelle, but even Activia Yogurt may be beneficial).  A humidifier placed in the bedroom may offer some relief for a dry, scratchy throat of nasal irritation.  Read information below on acute bronchitis. Please call or return to clinic if symptoms are not improving.  Acute Bronchitis Bronchitis is when the airways that extend from the windpipe into the lungs get red, puffy, and painful (inflamed). Bronchitis often causes thick spit (mucus) to develop. This leads to a cough. A cough is the most common symptom of bronchitis. In acute bronchitis, the condition usually begins suddenly and goes away over time (usually in 2 weeks). Smoking, allergies, and asthma can make bronchitis worse. Repeated episodes of bronchitis may cause more lung problems.  HOME CARE  Rest.  Drink enough fluids to keep your pee (urine) clear or pale yellow (unless you need to limit fluids as told by your doctor).  Only take over-the-counter or prescription medicines as told by your doctor.  Avoid smoking and secondhand smoke. These can make bronchitis worse. If you are a smoker, think about using nicotine gum or skin patches. Quitting smoking will help your lungs heal faster.  Reduce the chance of getting bronchitis again by:  Washing your hands often.  Avoiding people with cold symptoms.  Trying not to touch your hands to your mouth, nose, or eyes.  Follow up with your doctor as told.  GET HELP IF: Your symptoms do not improve after 1 week of treatment. Symptoms include:  Cough.  Fever.  Coughing up thick spit.  Body aches.  Chest congestion.  Chills.  Shortness of  breath.  Sore throat.  GET HELP RIGHT AWAY IF:   You have an increased fever.  You have chills.  You have severe shortness of breath.  You have bloody thick spit (sputum).  You throw up (vomit) often.  You lose too much body fluid (dehydration).  You have a severe headache.  You faint.  MAKE SURE YOU:   Understand these instructions.  Will watch your condition.  Will get help right away if you are not doing well or get worse. Document Released: 08/08/2007 Document Revised: 10/22/2012 Document Reviewed: 08/12/2012 [employer] Patient Information 2015 [place] [place]. This information is not intended to replace advice given to you by your health care provider. Make sure you discuss any questions you have with your health care provider.

## 2016-02-01 ENCOUNTER — Other Ambulatory Visit: Payer: Self-pay | Admitting: Physician Assistant

## 2016-08-23 ENCOUNTER — Encounter: Payer: Self-pay | Admitting: Physician Assistant

## 2016-08-24 ENCOUNTER — Encounter: Payer: Self-pay | Admitting: Physician Assistant

## 2016-08-25 ENCOUNTER — Other Ambulatory Visit: Payer: Self-pay | Admitting: Physician Assistant

## 2016-08-27 ENCOUNTER — Encounter: Payer: Self-pay | Admitting: Emergency Medicine

## 2016-08-27 NOTE — Telephone Encounter (Signed)
Sent a my chart message to schedule a follow up appointment for depression.

## 2016-09-10 ENCOUNTER — Encounter: Payer: Self-pay | Admitting: Emergency Medicine

## 2016-09-25 ENCOUNTER — Other Ambulatory Visit: Payer: Self-pay | Admitting: Physician Assistant

## 2017-01-02 ENCOUNTER — Telehealth: Payer: Self-pay | Admitting: Physician Assistant

## 2017-01-02 DIAGNOSIS — A6000 Herpesviral infection of urogenital system, unspecified: Secondary | ICD-10-CM

## 2017-01-02 NOTE — Telephone Encounter (Signed)
Pt requesting refill of acyclovir (ZOVIRAX) 400 MG tablet.  [facility]  [id number] [place], [place] - [address] [phone] (Phone) [phone] (Fax)

## 2017-01-02 NOTE — Telephone Encounter (Signed)
Will allow 30-day only. Patient overdue for follow-up -- last seen 12/2015. Recommend CPE.

## 2017-01-03 MED ORDER — ACYCLOVIR 400 MG PO TABS: 400.0000 mg | ORAL_TABLET | Freq: Two times a day (BID) | ORAL | 0 refills | Status: AC | PRN

## 2017-01-03 NOTE — Telephone Encounter (Signed)
Spoke with patient about refilling the Acyclovir for 30 day supply. She is agreeable. Advised she is due for a CPE. [place]. Medication sent to the pharmacy.

## 2017-07-24 ENCOUNTER — Encounter: Payer: Self-pay | Admitting: General Practice
